# Patient Record
Sex: Female | Born: 1935 | Race: Black or African American | Hispanic: No | Marital: Married | Smoking: Never smoker
Health system: Southern US, Community
[De-identification: ages and names within clinical notes are randomized; demographics above are authoritative.]

## PROBLEM LIST (undated history)

## (undated) DIAGNOSIS — I10 Essential (primary) hypertension: Secondary | ICD-10-CM

## (undated) DIAGNOSIS — E1140 Type 2 diabetes mellitus with diabetic neuropathy, unspecified: Secondary | ICD-10-CM

## (undated) DIAGNOSIS — E119 Type 2 diabetes mellitus without complications: Secondary | ICD-10-CM

## (undated) HISTORY — DX: Type 2 diabetes mellitus with diabetic neuropathy, unspecified: E11.40

## (undated) HISTORY — PX: CATARACT EXTRACTION: SUR2

---

## 2001-02-08 ENCOUNTER — Encounter: Payer: Self-pay | Admitting: Emergency Medicine

## 2001-02-08 ENCOUNTER — Emergency Department (HOSPITAL_COMMUNITY): Admission: EM | Admit: 2001-02-08 | Discharge: 2001-02-08 | Payer: Self-pay | Admitting: Emergency Medicine

## 2001-03-09 ENCOUNTER — Ambulatory Visit (HOSPITAL_COMMUNITY): Admission: RE | Admit: 2001-03-09 | Discharge: 2001-03-09 | Payer: Self-pay | Admitting: Internal Medicine

## 2001-03-09 ENCOUNTER — Encounter: Payer: Self-pay | Admitting: Internal Medicine

## 2001-03-14 ENCOUNTER — Other Ambulatory Visit: Admission: RE | Admit: 2001-03-14 | Discharge: 2001-03-14 | Payer: Self-pay | Admitting: Obstetrics and Gynecology

## 2002-07-11 ENCOUNTER — Ambulatory Visit (HOSPITAL_COMMUNITY): Admission: RE | Admit: 2002-07-11 | Discharge: 2002-07-11 | Payer: Self-pay | Admitting: Internal Medicine

## 2002-07-11 ENCOUNTER — Encounter: Payer: Self-pay | Admitting: Internal Medicine

## 2002-10-31 ENCOUNTER — Encounter: Payer: Self-pay | Admitting: Emergency Medicine

## 2002-10-31 ENCOUNTER — Emergency Department (HOSPITAL_COMMUNITY): Admission: EM | Admit: 2002-10-31 | Discharge: 2002-10-31 | Payer: Self-pay | Admitting: Emergency Medicine

## 2003-10-17 ENCOUNTER — Ambulatory Visit (HOSPITAL_COMMUNITY): Admission: RE | Admit: 2003-10-17 | Discharge: 2003-10-17 | Payer: Self-pay | Admitting: Internal Medicine

## 2004-09-30 ENCOUNTER — Ambulatory Visit (HOSPITAL_COMMUNITY): Admission: RE | Admit: 2004-09-30 | Discharge: 2004-09-30 | Payer: Self-pay | Admitting: Ophthalmology

## 2005-05-05 ENCOUNTER — Ambulatory Visit (HOSPITAL_COMMUNITY): Admission: RE | Admit: 2005-05-05 | Discharge: 2005-05-05 | Payer: Self-pay | Admitting: Ophthalmology

## 2005-11-09 ENCOUNTER — Ambulatory Visit (HOSPITAL_COMMUNITY): Admission: RE | Admit: 2005-11-09 | Discharge: 2005-11-09 | Payer: Self-pay | Admitting: Internal Medicine

## 2009-02-06 ENCOUNTER — Ambulatory Visit (HOSPITAL_COMMUNITY): Admission: RE | Admit: 2009-02-06 | Discharge: 2009-02-06 | Payer: Self-pay | Admitting: Internal Medicine

## 2010-10-24 NOTE — Op Note (Signed)
NAMESEREENA, MARANDO              ACCOUNT NO.:  [id number]   MEDICAL RECORD NO.:  [id number]          PATIENT TYPE:  AMB   LOCATION:  SDS                          FACILITY:  [facility]   PHYSICIAN:  Alford Highland. Rankin, M.D.   DATE OF BIRTH:  [birth year]   DATE OF PROCEDURE:  05/05/2005  DATE OF DISCHARGE:                                 OPERATIVE REPORT   PREOPERATIVE DIAGNOSIS:  1.  Clinically significant macular edema of the right eye.  2.  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  3.  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   POSTOPERATIVE DIAGNOSIS:  1.  Clinically significant macular edema of the right eye.  2.  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  3.  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   PROCEDURE:  Posterior vitrectomy with endolaser panphotocoagulation OD.   ANESTHESIA:  Local retrobulbar and monitored anesthesia care.   SURGEON:  Alford Highland. Rankin, M.D.   INDICATIONS FOR PROCEDURE:  The patient is a 77 year old woman who has  persistent chronic cystic macular edema despite previous panphotocoagulation  and previous intravitreal Kenalog.  The patient understands this is an  attempt to induce coalescence of diabetic retinopathy but also to induce  resolution of the chronic macular edema and preserve, protect, and  potentially improve visual acuity function of this right eye.  The patient  understands the risks of anesthesia including the occurrence of death, as  well as to the eye, including but not limited to hemorrhage, infection,  scarring, need for further surgery, no change in vision, loss of vision,  progression of disease despite intervention.  Appropriate signed consent was  obtained.  The patient was taken to the operating room.   In the operating room, appropriate monitors were applied followed by mild  sedations.  0.75% Marcaine delivered, 5 mL retrobulbar and an additional 5  mL for a modified  [employer]  The right periocular region was sterilely  prepped and draped in the usual ophthalmic fashion.  A lid speculum was  applied.  A 25 gauge vitrectomy was then carried out and biplane entries  were made in each of the quadrants.  A core vitrectomy was then begun.  The  vitreous skirt trimmed 360 degrees.  The hyaloid did not require iatrogenic  removal.  At this time, endolaser photocoagulation was placed in the far  retinal periphery, particularly inferiorly, nasally, and temporally.  No  complications occurred.  Under high infusion pressures, the trocars were  removed and the high end pressure in the eye was maintained with  no egress in the subconjunctival regions.  Subconjunctival injections of  Decadron were applied.  A sterile patch and Fox shield were applied.  The  patient was awakened and taken to the short stay area to be discharged home  as an outpatient.      Alford Highland Rankin, M.D.  Electronically Signed     GAR/MEDQ  D:  05/05/2005  T:  05/05/2005  Job:  [id number]

## 2012-08-22 ENCOUNTER — Ambulatory Visit (HOSPITAL_COMMUNITY)
Admission: RE | Admit: 2012-08-22 | Discharge: 2012-08-22 | Disposition: A | Discharge status: Home / Self Care | Payer: Medicare HMO | Source: Ambulatory Visit | Attending: Internal Medicine | Admitting: Internal Medicine

## 2012-08-22 ENCOUNTER — Other Ambulatory Visit (HOSPITAL_COMMUNITY): Payer: Self-pay | Admitting: Internal Medicine

## 2012-08-22 DIAGNOSIS — N19 Unspecified kidney failure: Secondary | ICD-10-CM | POA: Insufficient documentation

## 2014-06-14 DIAGNOSIS — I120 Hypertensive chronic kidney disease with stage 5 chronic kidney disease or end stage renal disease: Secondary | ICD-10-CM | POA: Diagnosis not present

## 2014-06-14 DIAGNOSIS — E119 Type 2 diabetes mellitus without complications: Secondary | ICD-10-CM | POA: Diagnosis not present

## 2014-06-14 DIAGNOSIS — N189 Chronic kidney disease, unspecified: Secondary | ICD-10-CM | POA: Diagnosis not present

## 2014-06-14 DIAGNOSIS — F419 Anxiety disorder, unspecified: Secondary | ICD-10-CM | POA: Diagnosis not present

## 2014-06-14 DIAGNOSIS — E785 Hyperlipidemia, unspecified: Secondary | ICD-10-CM | POA: Diagnosis not present

## 2014-06-14 DIAGNOSIS — Z794 Long term (current) use of insulin: Secondary | ICD-10-CM | POA: Diagnosis not present

## 2014-06-25 DIAGNOSIS — H31009 Unspecified chorioretinal scars, unspecified eye: Secondary | ICD-10-CM | POA: Diagnosis not present

## 2014-06-25 DIAGNOSIS — H4011X3 Primary open-angle glaucoma, severe stage: Secondary | ICD-10-CM | POA: Diagnosis not present

## 2014-06-25 DIAGNOSIS — E11359 Type 2 diabetes mellitus with proliferative diabetic retinopathy without macular edema: Secondary | ICD-10-CM | POA: Diagnosis not present

## 2014-06-27 DIAGNOSIS — N189 Chronic kidney disease, unspecified: Secondary | ICD-10-CM | POA: Diagnosis not present

## 2014-06-27 DIAGNOSIS — I120 Hypertensive chronic kidney disease with stage 5 chronic kidney disease or end stage renal disease: Secondary | ICD-10-CM | POA: Diagnosis not present

## 2014-06-27 DIAGNOSIS — E119 Type 2 diabetes mellitus without complications: Secondary | ICD-10-CM | POA: Diagnosis not present

## 2014-06-27 DIAGNOSIS — F419 Anxiety disorder, unspecified: Secondary | ICD-10-CM | POA: Diagnosis not present

## 2014-06-27 DIAGNOSIS — Z794 Long term (current) use of insulin: Secondary | ICD-10-CM | POA: Diagnosis not present

## 2014-06-27 DIAGNOSIS — E785 Hyperlipidemia, unspecified: Secondary | ICD-10-CM | POA: Diagnosis not present

## 2014-06-29 DIAGNOSIS — I10 Essential (primary) hypertension: Secondary | ICD-10-CM | POA: Diagnosis not present

## 2014-06-29 DIAGNOSIS — E1140 Type 2 diabetes mellitus with diabetic neuropathy, unspecified: Secondary | ICD-10-CM | POA: Diagnosis not present

## 2014-07-10 DIAGNOSIS — I120 Hypertensive chronic kidney disease with stage 5 chronic kidney disease or end stage renal disease: Secondary | ICD-10-CM | POA: Diagnosis not present

## 2014-07-10 DIAGNOSIS — E785 Hyperlipidemia, unspecified: Secondary | ICD-10-CM | POA: Diagnosis not present

## 2014-07-10 DIAGNOSIS — N189 Chronic kidney disease, unspecified: Secondary | ICD-10-CM | POA: Diagnosis not present

## 2014-07-10 DIAGNOSIS — F419 Anxiety disorder, unspecified: Secondary | ICD-10-CM | POA: Diagnosis not present

## 2014-07-10 DIAGNOSIS — E119 Type 2 diabetes mellitus without complications: Secondary | ICD-10-CM | POA: Diagnosis not present

## 2014-07-10 DIAGNOSIS — Z794 Long term (current) use of insulin: Secondary | ICD-10-CM | POA: Diagnosis not present

## 2014-07-11 DIAGNOSIS — E1140 Type 2 diabetes mellitus with diabetic neuropathy, unspecified: Secondary | ICD-10-CM | POA: Diagnosis not present

## 2014-07-11 DIAGNOSIS — I10 Essential (primary) hypertension: Secondary | ICD-10-CM | POA: Diagnosis not present

## 2014-07-23 DIAGNOSIS — I120 Hypertensive chronic kidney disease with stage 5 chronic kidney disease or end stage renal disease: Secondary | ICD-10-CM | POA: Diagnosis not present

## 2014-07-23 DIAGNOSIS — Z794 Long term (current) use of insulin: Secondary | ICD-10-CM | POA: Diagnosis not present

## 2014-07-23 DIAGNOSIS — F419 Anxiety disorder, unspecified: Secondary | ICD-10-CM | POA: Diagnosis not present

## 2014-07-23 DIAGNOSIS — E785 Hyperlipidemia, unspecified: Secondary | ICD-10-CM | POA: Diagnosis not present

## 2014-07-23 DIAGNOSIS — N189 Chronic kidney disease, unspecified: Secondary | ICD-10-CM | POA: Diagnosis not present

## 2014-07-23 DIAGNOSIS — E119 Type 2 diabetes mellitus without complications: Secondary | ICD-10-CM | POA: Diagnosis not present

## 2014-07-25 DIAGNOSIS — I10 Essential (primary) hypertension: Secondary | ICD-10-CM | POA: Diagnosis not present

## 2014-07-25 DIAGNOSIS — E1140 Type 2 diabetes mellitus with diabetic neuropathy, unspecified: Secondary | ICD-10-CM | POA: Diagnosis not present

## 2014-08-28 DIAGNOSIS — I10 Essential (primary) hypertension: Secondary | ICD-10-CM | POA: Diagnosis not present

## 2014-08-28 DIAGNOSIS — E1140 Type 2 diabetes mellitus with diabetic neuropathy, unspecified: Secondary | ICD-10-CM | POA: Diagnosis not present

## 2014-10-03 DIAGNOSIS — I10 Essential (primary) hypertension: Secondary | ICD-10-CM | POA: Diagnosis not present

## 2014-10-03 DIAGNOSIS — E1140 Type 2 diabetes mellitus with diabetic neuropathy, unspecified: Secondary | ICD-10-CM | POA: Diagnosis not present

## 2014-10-19 DIAGNOSIS — I10 Essential (primary) hypertension: Secondary | ICD-10-CM | POA: Diagnosis not present

## 2014-10-19 DIAGNOSIS — N181 Chronic kidney disease, stage 1: Secondary | ICD-10-CM | POA: Diagnosis not present

## 2014-10-19 DIAGNOSIS — N390 Urinary tract infection, site not specified: Secondary | ICD-10-CM | POA: Diagnosis not present

## 2014-10-19 DIAGNOSIS — E785 Hyperlipidemia, unspecified: Secondary | ICD-10-CM | POA: Diagnosis not present

## 2014-10-19 DIAGNOSIS — E119 Type 2 diabetes mellitus without complications: Secondary | ICD-10-CM | POA: Diagnosis not present

## 2014-10-19 DIAGNOSIS — I120 Hypertensive chronic kidney disease with stage 5 chronic kidney disease or end stage renal disease: Secondary | ICD-10-CM | POA: Diagnosis not present

## 2014-10-19 DIAGNOSIS — E1142 Type 2 diabetes mellitus with diabetic polyneuropathy: Secondary | ICD-10-CM | POA: Diagnosis not present

## 2014-10-22 DIAGNOSIS — E11359 Type 2 diabetes mellitus with proliferative diabetic retinopathy without macular edema: Secondary | ICD-10-CM | POA: Diagnosis not present

## 2014-10-22 DIAGNOSIS — H4011X3 Primary open-angle glaucoma, severe stage: Secondary | ICD-10-CM | POA: Diagnosis not present

## 2014-10-29 DIAGNOSIS — D638 Anemia in other chronic diseases classified elsewhere: Secondary | ICD-10-CM | POA: Diagnosis not present

## 2014-10-29 DIAGNOSIS — R809 Proteinuria, unspecified: Secondary | ICD-10-CM | POA: Diagnosis not present

## 2014-10-29 DIAGNOSIS — I10 Essential (primary) hypertension: Secondary | ICD-10-CM | POA: Diagnosis not present

## 2014-10-29 DIAGNOSIS — E1129 Type 2 diabetes mellitus with other diabetic kidney complication: Secondary | ICD-10-CM | POA: Diagnosis not present

## 2014-10-29 DIAGNOSIS — N183 Chronic kidney disease, stage 3 (moderate): Secondary | ICD-10-CM | POA: Diagnosis not present

## 2014-10-31 DIAGNOSIS — N183 Chronic kidney disease, stage 3 (moderate): Secondary | ICD-10-CM | POA: Diagnosis not present

## 2014-10-31 DIAGNOSIS — I10 Essential (primary) hypertension: Secondary | ICD-10-CM | POA: Diagnosis not present

## 2014-10-31 DIAGNOSIS — R809 Proteinuria, unspecified: Secondary | ICD-10-CM | POA: Diagnosis not present

## 2014-10-31 DIAGNOSIS — D638 Anemia in other chronic diseases classified elsewhere: Secondary | ICD-10-CM | POA: Diagnosis not present

## 2014-11-07 DIAGNOSIS — H4011X3 Primary open-angle glaucoma, severe stage: Secondary | ICD-10-CM | POA: Diagnosis not present

## 2014-11-19 DIAGNOSIS — I10 Essential (primary) hypertension: Secondary | ICD-10-CM | POA: Diagnosis not present

## 2014-11-19 DIAGNOSIS — E1140 Type 2 diabetes mellitus with diabetic neuropathy, unspecified: Secondary | ICD-10-CM | POA: Diagnosis not present

## 2014-12-19 DIAGNOSIS — E1140 Type 2 diabetes mellitus with diabetic neuropathy, unspecified: Secondary | ICD-10-CM | POA: Diagnosis not present

## 2014-12-19 DIAGNOSIS — I10 Essential (primary) hypertension: Secondary | ICD-10-CM | POA: Diagnosis not present

## 2015-01-01 DIAGNOSIS — I10 Essential (primary) hypertension: Secondary | ICD-10-CM | POA: Diagnosis not present

## 2015-01-01 DIAGNOSIS — Z961 Presence of intraocular lens: Secondary | ICD-10-CM | POA: Diagnosis not present

## 2015-01-01 DIAGNOSIS — H2512 Age-related nuclear cataract, left eye: Secondary | ICD-10-CM | POA: Diagnosis not present

## 2015-01-01 DIAGNOSIS — H18411 Arcus senilis, right eye: Secondary | ICD-10-CM | POA: Diagnosis not present

## 2015-01-19 DIAGNOSIS — I10 Essential (primary) hypertension: Secondary | ICD-10-CM | POA: Diagnosis not present

## 2015-01-19 DIAGNOSIS — E1140 Type 2 diabetes mellitus with diabetic neuropathy, unspecified: Secondary | ICD-10-CM | POA: Diagnosis not present

## 2015-02-19 DIAGNOSIS — E1140 Type 2 diabetes mellitus with diabetic neuropathy, unspecified: Secondary | ICD-10-CM | POA: Diagnosis not present

## 2015-02-19 DIAGNOSIS — I10 Essential (primary) hypertension: Secondary | ICD-10-CM | POA: Diagnosis not present

## 2015-02-27 DIAGNOSIS — E119 Type 2 diabetes mellitus without complications: Secondary | ICD-10-CM | POA: Diagnosis not present

## 2015-02-27 DIAGNOSIS — N390 Urinary tract infection, site not specified: Secondary | ICD-10-CM | POA: Diagnosis not present

## 2015-02-27 DIAGNOSIS — I120 Hypertensive chronic kidney disease with stage 5 chronic kidney disease or end stage renal disease: Secondary | ICD-10-CM | POA: Diagnosis not present

## 2015-02-27 DIAGNOSIS — I10 Essential (primary) hypertension: Secondary | ICD-10-CM | POA: Diagnosis not present

## 2015-02-27 DIAGNOSIS — E1165 Type 2 diabetes mellitus with hyperglycemia: Secondary | ICD-10-CM | POA: Diagnosis not present

## 2015-03-04 DIAGNOSIS — H25812 Combined forms of age-related cataract, left eye: Secondary | ICD-10-CM | POA: Diagnosis not present

## 2015-03-04 DIAGNOSIS — H2512 Age-related nuclear cataract, left eye: Secondary | ICD-10-CM | POA: Diagnosis not present

## 2015-03-29 DIAGNOSIS — I10 Essential (primary) hypertension: Secondary | ICD-10-CM | POA: Diagnosis not present

## 2015-03-29 DIAGNOSIS — E1140 Type 2 diabetes mellitus with diabetic neuropathy, unspecified: Secondary | ICD-10-CM | POA: Diagnosis not present

## 2015-04-01 DIAGNOSIS — E113553 Type 2 diabetes mellitus with stable proliferative diabetic retinopathy, bilateral: Secondary | ICD-10-CM | POA: Diagnosis not present

## 2015-04-01 DIAGNOSIS — H401132 Primary open-angle glaucoma, bilateral, moderate stage: Secondary | ICD-10-CM | POA: Diagnosis not present

## 2015-04-01 DIAGNOSIS — Z961 Presence of intraocular lens: Secondary | ICD-10-CM | POA: Diagnosis not present

## 2015-04-26 DIAGNOSIS — N390 Urinary tract infection, site not specified: Secondary | ICD-10-CM | POA: Diagnosis not present

## 2015-04-26 DIAGNOSIS — N182 Chronic kidney disease, stage 2 (mild): Secondary | ICD-10-CM | POA: Diagnosis not present

## 2015-04-26 DIAGNOSIS — F419 Anxiety disorder, unspecified: Secondary | ICD-10-CM | POA: Diagnosis not present

## 2015-04-26 DIAGNOSIS — E119 Type 2 diabetes mellitus without complications: Secondary | ICD-10-CM | POA: Diagnosis not present

## 2015-04-26 DIAGNOSIS — Z23 Encounter for immunization: Secondary | ICD-10-CM | POA: Diagnosis not present

## 2015-04-26 DIAGNOSIS — E1142 Type 2 diabetes mellitus with diabetic polyneuropathy: Secondary | ICD-10-CM | POA: Diagnosis not present

## 2015-04-26 DIAGNOSIS — I10 Essential (primary) hypertension: Secondary | ICD-10-CM | POA: Diagnosis not present

## 2015-04-26 DIAGNOSIS — I120 Hypertensive chronic kidney disease with stage 5 chronic kidney disease or end stage renal disease: Secondary | ICD-10-CM | POA: Diagnosis not present

## 2015-05-06 DIAGNOSIS — D638 Anemia in other chronic diseases classified elsewhere: Secondary | ICD-10-CM | POA: Diagnosis not present

## 2015-05-06 DIAGNOSIS — E1129 Type 2 diabetes mellitus with other diabetic kidney complication: Secondary | ICD-10-CM | POA: Diagnosis not present

## 2015-05-06 DIAGNOSIS — I10 Essential (primary) hypertension: Secondary | ICD-10-CM | POA: Diagnosis not present

## 2015-05-06 DIAGNOSIS — N183 Chronic kidney disease, stage 3 (moderate): Secondary | ICD-10-CM | POA: Diagnosis not present

## 2015-05-06 DIAGNOSIS — R809 Proteinuria, unspecified: Secondary | ICD-10-CM | POA: Diagnosis not present

## 2015-05-06 DIAGNOSIS — Z79899 Other long term (current) drug therapy: Secondary | ICD-10-CM | POA: Diagnosis not present

## 2015-05-08 DIAGNOSIS — N183 Chronic kidney disease, stage 3 (moderate): Secondary | ICD-10-CM | POA: Diagnosis not present

## 2015-05-08 DIAGNOSIS — I10 Essential (primary) hypertension: Secondary | ICD-10-CM | POA: Diagnosis not present

## 2015-05-08 DIAGNOSIS — E1129 Type 2 diabetes mellitus with other diabetic kidney complication: Secondary | ICD-10-CM | POA: Diagnosis not present

## 2015-05-08 DIAGNOSIS — R809 Proteinuria, unspecified: Secondary | ICD-10-CM | POA: Diagnosis not present

## 2015-05-08 DIAGNOSIS — D649 Anemia, unspecified: Secondary | ICD-10-CM | POA: Diagnosis not present

## 2015-05-27 DIAGNOSIS — E1142 Type 2 diabetes mellitus with diabetic polyneuropathy: Secondary | ICD-10-CM | POA: Diagnosis not present

## 2015-05-27 DIAGNOSIS — I10 Essential (primary) hypertension: Secondary | ICD-10-CM | POA: Diagnosis not present

## 2015-06-06 DIAGNOSIS — I10 Essential (primary) hypertension: Secondary | ICD-10-CM | POA: Diagnosis not present

## 2015-06-06 DIAGNOSIS — E1142 Type 2 diabetes mellitus with diabetic polyneuropathy: Secondary | ICD-10-CM | POA: Diagnosis not present

## 2015-06-06 DIAGNOSIS — G301 Alzheimer's disease with late onset: Secondary | ICD-10-CM | POA: Diagnosis not present

## 2015-06-07 DIAGNOSIS — H401131 Primary open-angle glaucoma, bilateral, mild stage: Secondary | ICD-10-CM | POA: Diagnosis not present

## 2015-06-07 DIAGNOSIS — Z961 Presence of intraocular lens: Secondary | ICD-10-CM | POA: Diagnosis not present

## 2015-06-07 DIAGNOSIS — H401133 Primary open-angle glaucoma, bilateral, severe stage: Secondary | ICD-10-CM | POA: Diagnosis not present

## 2015-07-07 DIAGNOSIS — I10 Essential (primary) hypertension: Secondary | ICD-10-CM | POA: Diagnosis not present

## 2015-07-07 DIAGNOSIS — E1142 Type 2 diabetes mellitus with diabetic polyneuropathy: Secondary | ICD-10-CM | POA: Diagnosis not present

## 2015-08-06 DIAGNOSIS — E1142 Type 2 diabetes mellitus with diabetic polyneuropathy: Secondary | ICD-10-CM | POA: Diagnosis not present

## 2015-08-06 DIAGNOSIS — I10 Essential (primary) hypertension: Secondary | ICD-10-CM | POA: Diagnosis not present

## 2015-09-03 DIAGNOSIS — E1142 Type 2 diabetes mellitus with diabetic polyneuropathy: Secondary | ICD-10-CM | POA: Diagnosis not present

## 2015-09-03 DIAGNOSIS — N182 Chronic kidney disease, stage 2 (mild): Secondary | ICD-10-CM | POA: Diagnosis not present

## 2015-09-04 DIAGNOSIS — E113553 Type 2 diabetes mellitus with stable proliferative diabetic retinopathy, bilateral: Secondary | ICD-10-CM | POA: Diagnosis not present

## 2015-09-04 DIAGNOSIS — H401132 Primary open-angle glaucoma, bilateral, moderate stage: Secondary | ICD-10-CM | POA: Diagnosis not present

## 2015-09-04 DIAGNOSIS — Z961 Presence of intraocular lens: Secondary | ICD-10-CM | POA: Diagnosis not present

## 2015-09-25 DIAGNOSIS — H401132 Primary open-angle glaucoma, bilateral, moderate stage: Secondary | ICD-10-CM | POA: Diagnosis not present

## 2015-09-25 DIAGNOSIS — Z961 Presence of intraocular lens: Secondary | ICD-10-CM | POA: Diagnosis not present

## 2015-09-25 DIAGNOSIS — E113553 Type 2 diabetes mellitus with stable proliferative diabetic retinopathy, bilateral: Secondary | ICD-10-CM | POA: Diagnosis not present

## 2015-10-04 DIAGNOSIS — I10 Essential (primary) hypertension: Secondary | ICD-10-CM | POA: Diagnosis not present

## 2015-10-04 DIAGNOSIS — E1142 Type 2 diabetes mellitus with diabetic polyneuropathy: Secondary | ICD-10-CM | POA: Diagnosis not present

## 2015-10-22 DIAGNOSIS — N183 Chronic kidney disease, stage 3 (moderate): Secondary | ICD-10-CM | POA: Diagnosis not present

## 2015-10-22 DIAGNOSIS — D509 Iron deficiency anemia, unspecified: Secondary | ICD-10-CM | POA: Diagnosis not present

## 2015-10-22 DIAGNOSIS — E1129 Type 2 diabetes mellitus with other diabetic kidney complication: Secondary | ICD-10-CM | POA: Diagnosis not present

## 2015-10-22 DIAGNOSIS — D638 Anemia in other chronic diseases classified elsewhere: Secondary | ICD-10-CM | POA: Diagnosis not present

## 2015-10-22 DIAGNOSIS — I10 Essential (primary) hypertension: Secondary | ICD-10-CM | POA: Diagnosis not present

## 2015-10-22 DIAGNOSIS — R809 Proteinuria, unspecified: Secondary | ICD-10-CM | POA: Diagnosis not present

## 2015-10-22 DIAGNOSIS — Z79899 Other long term (current) drug therapy: Secondary | ICD-10-CM | POA: Diagnosis not present

## 2015-10-25 DIAGNOSIS — N182 Chronic kidney disease, stage 2 (mild): Secondary | ICD-10-CM | POA: Diagnosis not present

## 2015-10-25 DIAGNOSIS — N390 Urinary tract infection, site not specified: Secondary | ICD-10-CM | POA: Diagnosis not present

## 2015-10-25 DIAGNOSIS — E119 Type 2 diabetes mellitus without complications: Secondary | ICD-10-CM | POA: Diagnosis not present

## 2015-10-25 DIAGNOSIS — I120 Hypertensive chronic kidney disease with stage 5 chronic kidney disease or end stage renal disease: Secondary | ICD-10-CM | POA: Diagnosis not present

## 2015-10-25 DIAGNOSIS — I10 Essential (primary) hypertension: Secondary | ICD-10-CM | POA: Diagnosis not present

## 2015-11-05 DIAGNOSIS — Z111 Encounter for screening for respiratory tuberculosis: Secondary | ICD-10-CM | POA: Diagnosis not present

## 2015-11-06 DIAGNOSIS — E1129 Type 2 diabetes mellitus with other diabetic kidney complication: Secondary | ICD-10-CM | POA: Diagnosis not present

## 2015-11-06 DIAGNOSIS — E559 Vitamin D deficiency, unspecified: Secondary | ICD-10-CM | POA: Diagnosis not present

## 2015-11-06 DIAGNOSIS — I10 Essential (primary) hypertension: Secondary | ICD-10-CM | POA: Diagnosis not present

## 2015-11-06 DIAGNOSIS — N183 Chronic kidney disease, stage 3 (moderate): Secondary | ICD-10-CM | POA: Diagnosis not present

## 2015-11-06 DIAGNOSIS — R809 Proteinuria, unspecified: Secondary | ICD-10-CM | POA: Diagnosis not present

## 2015-12-20 DIAGNOSIS — I10 Essential (primary) hypertension: Secondary | ICD-10-CM | POA: Diagnosis not present

## 2015-12-20 DIAGNOSIS — E1165 Type 2 diabetes mellitus with hyperglycemia: Secondary | ICD-10-CM | POA: Diagnosis not present

## 2016-01-20 DIAGNOSIS — E1142 Type 2 diabetes mellitus with diabetic polyneuropathy: Secondary | ICD-10-CM | POA: Diagnosis not present

## 2016-01-20 DIAGNOSIS — I10 Essential (primary) hypertension: Secondary | ICD-10-CM | POA: Diagnosis not present

## 2016-02-06 DIAGNOSIS — S8002XA Contusion of left knee, initial encounter: Secondary | ICD-10-CM | POA: Diagnosis not present

## 2016-02-06 DIAGNOSIS — M11261 Other chondrocalcinosis, right knee: Secondary | ICD-10-CM | POA: Diagnosis not present

## 2016-02-06 DIAGNOSIS — S8001XA Contusion of right knee, initial encounter: Secondary | ICD-10-CM | POA: Diagnosis not present

## 2016-02-06 DIAGNOSIS — M25561 Pain in right knee: Secondary | ICD-10-CM | POA: Diagnosis not present

## 2016-02-13 ENCOUNTER — Ambulatory Visit (HOSPITAL_COMMUNITY): Payer: Medicare HMO | Admitting: Physical Therapy

## 2016-02-13 ENCOUNTER — Encounter (HOSPITAL_COMMUNITY): Payer: Self-pay

## 2016-02-20 DIAGNOSIS — E119 Type 2 diabetes mellitus without complications: Secondary | ICD-10-CM | POA: Diagnosis not present

## 2016-02-20 DIAGNOSIS — I10 Essential (primary) hypertension: Secondary | ICD-10-CM | POA: Diagnosis not present

## 2016-03-20 DIAGNOSIS — M25561 Pain in right knee: Secondary | ICD-10-CM | POA: Diagnosis not present

## 2016-03-20 DIAGNOSIS — M170 Bilateral primary osteoarthritis of knee: Secondary | ICD-10-CM | POA: Diagnosis not present

## 2016-03-20 DIAGNOSIS — S8981XD Other specified injuries of right lower leg, subsequent encounter: Secondary | ICD-10-CM | POA: Diagnosis not present

## 2016-03-20 DIAGNOSIS — S8002XA Contusion of left knee, initial encounter: Secondary | ICD-10-CM | POA: Diagnosis not present

## 2016-03-20 DIAGNOSIS — S8982XD Other specified injuries of left lower leg, subsequent encounter: Secondary | ICD-10-CM | POA: Diagnosis not present

## 2016-03-21 DIAGNOSIS — E119 Type 2 diabetes mellitus without complications: Secondary | ICD-10-CM | POA: Diagnosis not present

## 2016-03-21 DIAGNOSIS — N182 Chronic kidney disease, stage 2 (mild): Secondary | ICD-10-CM | POA: Diagnosis not present

## 2016-04-01 DIAGNOSIS — Z961 Presence of intraocular lens: Secondary | ICD-10-CM | POA: Diagnosis not present

## 2016-04-01 DIAGNOSIS — H401132 Primary open-angle glaucoma, bilateral, moderate stage: Secondary | ICD-10-CM | POA: Diagnosis not present

## 2016-04-01 DIAGNOSIS — E113553 Type 2 diabetes mellitus with stable proliferative diabetic retinopathy, bilateral: Secondary | ICD-10-CM | POA: Diagnosis not present

## 2016-04-24 ENCOUNTER — Other Ambulatory Visit (HOSPITAL_COMMUNITY): Payer: Self-pay | Admitting: Internal Medicine

## 2016-04-24 DIAGNOSIS — I10 Essential (primary) hypertension: Secondary | ICD-10-CM | POA: Diagnosis not present

## 2016-04-24 DIAGNOSIS — Z780 Asymptomatic menopausal state: Secondary | ICD-10-CM

## 2016-04-24 DIAGNOSIS — Z0000 Encounter for general adult medical examination without abnormal findings: Secondary | ICD-10-CM | POA: Diagnosis not present

## 2016-04-24 DIAGNOSIS — E119 Type 2 diabetes mellitus without complications: Secondary | ICD-10-CM | POA: Diagnosis not present

## 2016-04-24 DIAGNOSIS — F419 Anxiety disorder, unspecified: Secondary | ICD-10-CM | POA: Diagnosis not present

## 2016-04-24 DIAGNOSIS — N390 Urinary tract infection, site not specified: Secondary | ICD-10-CM | POA: Diagnosis not present

## 2016-04-24 DIAGNOSIS — E1142 Type 2 diabetes mellitus with diabetic polyneuropathy: Secondary | ICD-10-CM | POA: Diagnosis not present

## 2016-04-24 DIAGNOSIS — Z23 Encounter for immunization: Secondary | ICD-10-CM | POA: Diagnosis not present

## 2016-04-24 DIAGNOSIS — I70234 Atherosclerosis of native arteries of right leg with ulceration of heel and midfoot: Secondary | ICD-10-CM | POA: Diagnosis not present

## 2016-04-24 DIAGNOSIS — I120 Hypertensive chronic kidney disease with stage 5 chronic kidney disease or end stage renal disease: Secondary | ICD-10-CM | POA: Diagnosis not present

## 2016-04-26 ENCOUNTER — Encounter (HOSPITAL_COMMUNITY): Payer: Self-pay | Admitting: Emergency Medicine

## 2016-04-26 ENCOUNTER — Emergency Department (HOSPITAL_COMMUNITY)
Admission: EM | Admit: 2016-04-26 | Discharge: 2016-04-26 | Disposition: A | Discharge status: Home / Self Care | Payer: Commercial Managed Care - HMO | Attending: Emergency Medicine | Admitting: Emergency Medicine

## 2016-04-26 ENCOUNTER — Emergency Department (HOSPITAL_COMMUNITY): Payer: Commercial Managed Care - HMO

## 2016-04-26 DIAGNOSIS — Y999 Unspecified external cause status: Secondary | ICD-10-CM | POA: Diagnosis not present

## 2016-04-26 DIAGNOSIS — Y939 Activity, unspecified: Secondary | ICD-10-CM | POA: Insufficient documentation

## 2016-04-26 DIAGNOSIS — Y9222 Religious institution as the place of occurrence of the external cause: Secondary | ICD-10-CM | POA: Insufficient documentation

## 2016-04-26 DIAGNOSIS — M25512 Pain in left shoulder: Secondary | ICD-10-CM | POA: Diagnosis not present

## 2016-04-26 DIAGNOSIS — W19XXXA Unspecified fall, initial encounter: Secondary | ICD-10-CM

## 2016-04-26 DIAGNOSIS — E119 Type 2 diabetes mellitus without complications: Secondary | ICD-10-CM | POA: Diagnosis not present

## 2016-04-26 DIAGNOSIS — S4992XA Unspecified injury of left shoulder and upper arm, initial encounter: Secondary | ICD-10-CM | POA: Diagnosis not present

## 2016-04-26 DIAGNOSIS — S40022A Contusion of left upper arm, initial encounter: Secondary | ICD-10-CM | POA: Diagnosis not present

## 2016-04-26 DIAGNOSIS — S60222A Contusion of left hand, initial encounter: Secondary | ICD-10-CM | POA: Diagnosis not present

## 2016-04-26 DIAGNOSIS — I10 Essential (primary) hypertension: Secondary | ICD-10-CM | POA: Insufficient documentation

## 2016-04-26 DIAGNOSIS — W108XXA Fall (on) (from) other stairs and steps, initial encounter: Secondary | ICD-10-CM | POA: Insufficient documentation

## 2016-04-26 HISTORY — DX: Type 2 diabetes mellitus without complications: E11.9

## 2016-04-26 HISTORY — DX: Essential (primary) hypertension: I10

## 2016-04-26 NOTE — ED Triage Notes (Signed)
Pt fell down the last step and landed on her L arm. Pt denies hitting her head. Pt c/o L shoulder pain.

## 2016-04-26 NOTE — Discharge Instructions (Signed)
X-rays of your arm and shoulder showed no obvious fracture. Ice, sling, Tylenol or Motrin for pain. Follow-up with orthopedic doctor if not improving. Phone number given.

## 2016-04-26 NOTE — ED Provider Notes (Signed)
[facility] DEPT Provider Note   CSN: [id number] Arrival date & time: 04/26/16  1215     History   Chief Complaint Chief Complaint  Patient presents with  . Fall    HPI Annette Fitzgerald is a 82 y.o. female.  Accidental mechanical fall [place] last step [place] [employer] striking her left humerus. No head or neck trauma. No loss of consciousness or neurological deficits. Severity of pain is moderate.      Past Medical History:  Diagnosis Date  . Diabetes mellitus without complication (HCC)   . Hypertension     There are no active problems to display for this patient.   Past Surgical History:  Procedure Laterality Date  . CATARACT EXTRACTION      OB History    Gravida Para Term Preterm AB Living   2 2 2          SAB TAB Ectopic Multiple Live Births                   Home Medications    Prior to Admission medications   Not on File    Family History History reviewed. No pertinent family history.  Social History Social History  Substance Use Topics  . Smoking status: Never Smoker  . Smokeless tobacco: Never Used  . Alcohol use No     Allergies   Patient has no known allergies.   Review of Systems Review of Systems  All other systems reviewed and are negative.    Physical Exam Updated Vital Signs BP 178/67 (BP Location: Right Arm)   Pulse (!) 58   Temp 97.4 F (36.3 C) (Oral)   Resp 16   Ht 5\' 6"  (1.676 m)   Wt 155 lb (70.3 kg)   SpO2 100%   BMI 25.02 kg/m   Physical Exam  Constitutional: She is oriented to person, place, and time. She appears well-developed and well-nourished.  HENT:  Head: Normocephalic and atraumatic.  Eyes: Conjunctivae are normal.  Neck: Neck supple.  Cardiovascular: Normal rate and regular rhythm.   Pulmonary/Chest: Effort normal and breath sounds normal.  Abdominal: Soft. Bowel sounds are normal.  Musculoskeletal:  Left upper extremity: Tender surrounding the left proximal humerus  Neurological: She is  alert and oriented to person, place, and time.  Skin: Skin is warm and dry.  Psychiatric: She has a normal mood and affect. Her behavior is normal.  Nursing note and vitals reviewed.    ED Treatments / Results  Labs (all labs ordered are listed, but only abnormal results are displayed) Labs Reviewed - No data to display  EKG  EKG Interpretation None       Radiology Dg Shoulder Left  Result Date: 04/26/2016 CLINICAL DATA:  Larey SeatFell on arm today.  Pain. EXAM: LEFT SHOULDER - 2+ VIEW COMPARISON:  None. FINDINGS: Degenerative changes in the left [facility]. Glenohumeral joint is intact. No acute bony abnormality. Specifically, no fracture, subluxation, or dislocation. Soft tissues are intact. IMPRESSION: No acute bony abnormality. Electronically Signed   By: Charlett NoseKevin  Dover M.D.   On: 04/26/2016 13:07   Dg Humerus Left  Result Date: 04/26/2016 CLINICAL DATA:  Initial encounter for Pt fell today EXAM: LEFT HUMERUS - 2+ VIEW COMPARISON:  Shoulder films, dictated separately. FINDINGS: No fracture dislocation about the distal humerus. IMPRESSION: No acute findings about the distal humerus. Please see shoulder films, dictated separately. Electronically Signed   By: Jeronimo GreavesKyle  Talbot M.D.   On: 04/26/2016 13:08    Procedures  Procedures (including critical care time)  Medications Ordered in ED Medications - No data to display   Initial Impression / Assessment and Plan / ED Course  I have reviewed the triage vital signs and the nursing notes.  Pertinent labs & imaging results that were available during my care of the patient were reviewed by me and considered in my medical decision making (see chart for details).  Clinical Course     Plain films of left shoulder and left humerus show no acute fractures. Sling, ice, OTC pain medicine, referral to orthopedics  Final Clinical Impressions(s) / ED Diagnoses   Final diagnoses:  Fall, initial encounter  Contusion of left upper extremity, initial  encounter    New Prescriptions New Prescriptions   No medications on file     Donnetta HutchingBrian Lilyanah Celestin, MD 04/27/16 [phone]

## 2016-05-05 ENCOUNTER — Other Ambulatory Visit (HOSPITAL_COMMUNITY): Payer: Medicare HMO

## 2016-05-11 DIAGNOSIS — D638 Anemia in other chronic diseases classified elsewhere: Secondary | ICD-10-CM | POA: Diagnosis not present

## 2016-05-11 DIAGNOSIS — R809 Proteinuria, unspecified: Secondary | ICD-10-CM | POA: Diagnosis not present

## 2016-05-11 DIAGNOSIS — Z79899 Other long term (current) drug therapy: Secondary | ICD-10-CM | POA: Diagnosis not present

## 2016-05-11 DIAGNOSIS — D509 Iron deficiency anemia, unspecified: Secondary | ICD-10-CM | POA: Diagnosis not present

## 2016-05-11 DIAGNOSIS — N183 Chronic kidney disease, stage 3 (moderate): Secondary | ICD-10-CM | POA: Diagnosis not present

## 2016-05-11 DIAGNOSIS — I10 Essential (primary) hypertension: Secondary | ICD-10-CM | POA: Diagnosis not present

## 2016-05-11 DIAGNOSIS — E1129 Type 2 diabetes mellitus with other diabetic kidney complication: Secondary | ICD-10-CM | POA: Diagnosis not present

## 2016-05-13 DIAGNOSIS — I10 Essential (primary) hypertension: Secondary | ICD-10-CM | POA: Diagnosis not present

## 2016-05-13 DIAGNOSIS — R809 Proteinuria, unspecified: Secondary | ICD-10-CM | POA: Diagnosis not present

## 2016-05-13 DIAGNOSIS — E1129 Type 2 diabetes mellitus with other diabetic kidney complication: Secondary | ICD-10-CM | POA: Diagnosis not present

## 2016-05-13 DIAGNOSIS — N183 Chronic kidney disease, stage 3 (moderate): Secondary | ICD-10-CM | POA: Diagnosis not present

## 2016-05-18 ENCOUNTER — Ambulatory Visit (HOSPITAL_COMMUNITY)
Admission: RE | Admit: 2016-05-18 | Discharge: 2016-05-18 | Disposition: A | Discharge status: Home / Self Care | Payer: Commercial Managed Care - HMO | Source: Ambulatory Visit | Attending: Internal Medicine | Admitting: Internal Medicine

## 2016-05-18 DIAGNOSIS — Z780 Asymptomatic menopausal state: Secondary | ICD-10-CM | POA: Insufficient documentation

## 2016-05-18 DIAGNOSIS — M810 Age-related osteoporosis without current pathological fracture: Secondary | ICD-10-CM | POA: Diagnosis not present

## 2016-05-18 DIAGNOSIS — M818 Other osteoporosis without current pathological fracture: Secondary | ICD-10-CM | POA: Diagnosis not present

## 2016-05-21 ENCOUNTER — Other Ambulatory Visit (HOSPITAL_COMMUNITY): Payer: Self-pay | Admitting: Orthopedic Surgery

## 2016-05-21 DIAGNOSIS — S43492A Other sprain of left shoulder joint, initial encounter: Secondary | ICD-10-CM | POA: Diagnosis not present

## 2016-05-21 DIAGNOSIS — S46912A Strain of unspecified muscle, fascia and tendon at shoulder and upper arm level, left arm, initial encounter: Secondary | ICD-10-CM

## 2016-05-21 DIAGNOSIS — S43402A Unspecified sprain of left shoulder joint, initial encounter: Secondary | ICD-10-CM

## 2016-05-21 DIAGNOSIS — S46812A Strain of other muscles, fascia and tendons at shoulder and upper arm level, left arm, initial encounter: Secondary | ICD-10-CM | POA: Diagnosis not present

## 2016-05-21 DIAGNOSIS — M25512 Pain in left shoulder: Secondary | ICD-10-CM | POA: Diagnosis not present

## 2016-05-21 DIAGNOSIS — S46819A Strain of other muscles, fascia and tendons at shoulder and upper arm level, unspecified arm, initial encounter: Secondary | ICD-10-CM | POA: Diagnosis not present

## 2016-05-24 DIAGNOSIS — E1142 Type 2 diabetes mellitus with diabetic polyneuropathy: Secondary | ICD-10-CM | POA: Diagnosis not present

## 2016-05-24 DIAGNOSIS — I10 Essential (primary) hypertension: Secondary | ICD-10-CM | POA: Diagnosis not present

## 2016-05-28 ENCOUNTER — Ambulatory Visit (HOSPITAL_COMMUNITY)
Admission: RE | Admit: 2016-05-28 | Discharge: 2016-05-28 | Disposition: A | Discharge status: Home / Self Care | Payer: Commercial Managed Care - HMO | Source: Ambulatory Visit | Attending: Orthopedic Surgery | Admitting: Orthopedic Surgery

## 2016-05-28 DIAGNOSIS — S46812A Strain of other muscles, fascia and tendons at shoulder and upper arm level, left arm, initial encounter: Secondary | ICD-10-CM | POA: Diagnosis not present

## 2016-05-28 DIAGNOSIS — M75112 Incomplete rotator cuff tear or rupture of left shoulder, not specified as traumatic: Secondary | ICD-10-CM | POA: Insufficient documentation

## 2016-05-28 DIAGNOSIS — S43402A Unspecified sprain of left shoulder joint, initial encounter: Secondary | ICD-10-CM | POA: Diagnosis present

## 2016-05-28 DIAGNOSIS — S43022A Posterior subluxation of left humerus, initial encounter: Secondary | ICD-10-CM | POA: Insufficient documentation

## 2016-05-28 DIAGNOSIS — M19012 Primary osteoarthritis, left shoulder: Secondary | ICD-10-CM | POA: Diagnosis not present

## 2016-05-28 DIAGNOSIS — S46912A Strain of unspecified muscle, fascia and tendon at shoulder and upper arm level, left arm, initial encounter: Secondary | ICD-10-CM

## 2016-05-28 DIAGNOSIS — M7552 Bursitis of left shoulder: Secondary | ICD-10-CM | POA: Diagnosis not present

## 2016-05-28 DIAGNOSIS — R600 Localized edema: Secondary | ICD-10-CM | POA: Diagnosis not present

## 2016-05-28 DIAGNOSIS — M65812 Other synovitis and tenosynovitis, left shoulder: Secondary | ICD-10-CM | POA: Diagnosis not present

## 2016-05-28 DIAGNOSIS — X58XXXA Exposure to other specified factors, initial encounter: Secondary | ICD-10-CM | POA: Diagnosis not present

## 2016-06-09 DIAGNOSIS — M25512 Pain in left shoulder: Secondary | ICD-10-CM | POA: Diagnosis not present

## 2016-06-09 DIAGNOSIS — S46012A Strain of muscle(s) and tendon(s) of the rotator cuff of left shoulder, initial encounter: Secondary | ICD-10-CM | POA: Diagnosis not present

## 2016-06-24 DIAGNOSIS — E1142 Type 2 diabetes mellitus with diabetic polyneuropathy: Secondary | ICD-10-CM | POA: Diagnosis not present

## 2016-06-24 DIAGNOSIS — I10 Essential (primary) hypertension: Secondary | ICD-10-CM | POA: Diagnosis not present

## 2016-07-07 DIAGNOSIS — M25512 Pain in left shoulder: Secondary | ICD-10-CM | POA: Diagnosis not present

## 2016-07-21 DIAGNOSIS — J069 Acute upper respiratory infection, unspecified: Secondary | ICD-10-CM | POA: Diagnosis not present

## 2016-07-21 DIAGNOSIS — J309 Allergic rhinitis, unspecified: Secondary | ICD-10-CM | POA: Diagnosis not present

## 2016-08-18 DIAGNOSIS — J309 Allergic rhinitis, unspecified: Secondary | ICD-10-CM | POA: Diagnosis not present

## 2016-08-18 DIAGNOSIS — J069 Acute upper respiratory infection, unspecified: Secondary | ICD-10-CM | POA: Diagnosis not present

## 2016-09-17 DIAGNOSIS — I10 Essential (primary) hypertension: Secondary | ICD-10-CM | POA: Diagnosis not present

## 2016-09-17 DIAGNOSIS — N182 Chronic kidney disease, stage 2 (mild): Secondary | ICD-10-CM | POA: Diagnosis not present

## 2016-09-23 DIAGNOSIS — E113553 Type 2 diabetes mellitus with stable proliferative diabetic retinopathy, bilateral: Secondary | ICD-10-CM | POA: Diagnosis not present

## 2016-09-23 DIAGNOSIS — H401132 Primary open-angle glaucoma, bilateral, moderate stage: Secondary | ICD-10-CM | POA: Diagnosis not present

## 2016-09-23 DIAGNOSIS — Z961 Presence of intraocular lens: Secondary | ICD-10-CM | POA: Diagnosis not present

## 2016-10-15 DIAGNOSIS — E784 Other hyperlipidemia: Secondary | ICD-10-CM | POA: Diagnosis not present

## 2016-10-15 DIAGNOSIS — E1142 Type 2 diabetes mellitus with diabetic polyneuropathy: Secondary | ICD-10-CM | POA: Diagnosis not present

## 2016-10-15 DIAGNOSIS — I10 Essential (primary) hypertension: Secondary | ICD-10-CM | POA: Diagnosis not present

## 2016-10-15 DIAGNOSIS — N182 Chronic kidney disease, stage 2 (mild): Secondary | ICD-10-CM | POA: Diagnosis not present

## 2016-11-27 ENCOUNTER — Encounter (HOSPITAL_COMMUNITY): Payer: Self-pay

## 2016-11-27 ENCOUNTER — Ambulatory Visit (HOSPITAL_COMMUNITY): Payer: Commercial Managed Care - HMO | Attending: Specialist

## 2016-11-27 DIAGNOSIS — G8929 Other chronic pain: Secondary | ICD-10-CM | POA: Diagnosis not present

## 2016-11-27 DIAGNOSIS — M25512 Pain in left shoulder: Secondary | ICD-10-CM | POA: Insufficient documentation

## 2016-11-27 DIAGNOSIS — M25612 Stiffness of left shoulder, not elsewhere classified: Secondary | ICD-10-CM | POA: Diagnosis not present

## 2016-11-27 NOTE — Patient Instructions (Signed)
1) Shoulder Protraction    Begin with elbows by your side, slowly "punch" straight out in front of you keeping arms/elbows straight.      2) Shoulder Flexion  Supine:     Standing:         Begin with arms at your side with thumbs pointed up, slowly raise both arms up and forward towards overhead.               3) Horizontal abduction/adduction  Supine:   Standing:           Begin with arms straight out in front of you, bring out to the side in at "T" shape. Keep arms straight entire time.                 4) Internal & External Rotation    *No band* -Stand with elbows at the side and elbows bent 90 degrees. Move your forearms away from your body, then bring back inward toward the body.     5) Shoulder Abduction  Supine:     Standing:       Lying on your back begin with your arms flat on the table next to your side. Slowly move your arms out to the side so that they go overhead, in a jumping jack or snow angel movement.    6) X to V arms (cheerleader move):  Begin with arms straight down, crossed in front of body in an "X". Keeping arms crossed, lift arms straight up overhead. Then spread arms apart into a "V" shape.  Bring back together into x and lower down to starting position.    7) W arms:  Begin with elbows bent and even with shoulders, hands pointing to ceiling. Keeping elbows at shoulder level, 1-shrug shoulders up, 2-squeeze shoulder blades together, and 3-relax.    Repeat all exercises 10-15 times, 1-2 times per day.  

## 2016-11-27 NOTE — Therapy (Addendum)
[facility] [address] [place] [place], [place], [address] Phone: [phone]   Fax:  [phone]  Occupational Therapy Evaluation  Patient Details  Name: Annette Fitzgerald MRN: [id number] Date of Birth: [birth year] Referring Provider: Antionette PolesJefferey Beane, MD  Encounter Date: 11/27/2016      OT End of Session - 11/27/16 1446    Visit Number 1   Number of Visits 1   Authorization Type Medicare   Authorization - Visit Number 1   Authorization - Number of Visits 10   OT Start Time 1348   OT Stop Time 1430   OT Time Calculation (min) 42 min   Activity Tolerance Patient tolerated treatment well   Behavior During Therapy [facility] for tasks assessed/performed      Past Medical History:  Diagnosis Date  . Diabetes mellitus without complication (HCC)   . Hypertension     Past Surgical History:  Procedure Laterality Date  . CATARACT EXTRACTION      There were no vitals filed for this visit.      Subjective Assessment - 11/27/16 1440    Subjective  S: I can still pretty much do everything my arm just hurts.   Pertinent History Pt is a 83 year old female who had a fall on 04/26/16 resulting in left arm pain. Dr. Shelle IronBeane has referred pt to occupational therapy for evaluation as a one time visit and to receive an HEP.   Limitations No medications were listed for pt and she could not recall all of them nor did she bring them. Medications were not able to be enetered during the session.   Patient Stated Goals Pt stated that she wants to be able to move her arm better and use it more as well as clip her bra easier.   Currently in Pain? No/denies           [facility] - 11/27/16 1433      Assessment   Diagnosis Left Shoulder Pain   Referring Provider Antionette PolesJefferey Beane, MD   Onset Date 04/26/16   Assessment No follow-up appointment   Prior Therapy none     Precautions   Precautions None     Restrictions   Weight Bearing Restrictions No      Balance Screen   Has the patient fallen in the past 6 months Yes   How many times? 1   Has the patient had a decrease in activity level because of a fear of falling?  No   Is the patient reluctant to leave their home because of a fear of falling?  No     Home  Environment   Family/patient expects to be discharged to: Private residence   Lives With Daughter;Other (Comment)  Granddaughter     Prior Function   Level of Independence Independent   Vocation Retired   Leisure watching gameshows, fishing     ADL   ADL comments Independent with all ADLs, daughter cooks meals for her and did prior to fall as well.     Mobility   Mobility Status Independent     Written Expression   Dominant Hand Right     Vision - History   Baseline Vision Wears glasses all the time     Cognition   Overall Cognitive Status Within Functional Limits for tasks assessed     ROM / Strength   AROM / PROM / Strength AROM;PROM;Strength     Palpation   Patella mobility Pt had minimal fascial restrictions  in left trapezious, shoulder, and scapular regions.     AROM   Overall AROM  Deficits   Overall AROM Comments Assessed seated. Shoulder adducted for IR/er. Intially, before measurements, pt was able to raise arm up further then once measured she was unable to raise arm as high.   AROM Assessment Site Shoulder   Right/Left Shoulder Left   Left Shoulder Flexion 80 Degrees   Left Shoulder ABduction 95 Degrees   Left Shoulder Internal Rotation 90 Degrees   Left Shoulder External Rotation 45 Degrees     PROM   Overall PROM  Deficits   Overall PROM Comments Assessed supine. Shoulder aducted for IR/er.   PROM Assessment Site Shoulder   Right/Left Shoulder Left   Left Shoulder Flexion 130 Degrees   Left Shoulder ABduction 101 Degrees   Left Shoulder Internal Rotation 90 Degrees   Left Shoulder External Rotation 47 Degrees     Strength   Overall Strength Deficits   Strength Assessment Site Shoulder    Right/Left Shoulder Left   Left Shoulder Flexion 3+/5   Left Shoulder ABduction 4-/5   Left Shoulder Internal Rotation 5/5   Left Shoulder External Rotation 3+/5                         OT Education - 11/27/16 1445    Education provided Yes   Education Details Pt and daughter provided with HEP for shoulder A/ROM exercises, pt educated on self massage and pain management   Person(s) Educated Patient;Child(ren)   Methods Explanation;Demonstration;Verbal cues;Handout   Comprehension Returned demonstration;Verbalized understanding          OT Short Term Goals - 11/27/16 1457      OT SHORT TERM GOAL #1   Title Patient will be educated on a HEP for improved function of LUE during daily activities.   Time 1   Period Days   Status Achieved                  Plan - 11/27/16 1447    Clinical Impression Statement A: Patient is a 83 year old female who had a fall on 04/26/17 resulting in left arm pain causing fascial restrictions and decreased strength and ROM.   Occupational Profile and client history currently impacting functional performance Motivated to return to prior level of function, supportive daughter to help with HEP   Occupational performance deficits (Please refer to evaluation for details): ADL's;IADL's;Rest and Sleep;Leisure   Rehab Potential Good   Current Impairments/barriers affecting progress: Deficits in memory   OT Frequency One time visit   OT Treatment/Interventions Patient/family education   Plan P: One time visit, provided pt with HEP   Clinical Decision Making Limited treatment options, no task modification necessary   OT Home Exercise Plan 11/27/16: shoulder A/ROM exercises   Consulted and Agree with Plan of Care Patient;Family member/caregiver   Family Member Consulted Daughter      Patient will benefit from skilled therapeutic intervention in order to improve the following deficits and impairments:  Decreased range of motion,  Increased fascial restricitons, Impaired UE functional use, Pain, Decreased mobility, Decreased strength  Visit Diagnosis: Chronic left shoulder pain - Plan: Ot plan of care cert/re-cert  Stiffness of left shoulder, not elsewhere classified - Plan: Ot plan of care cert/re-cert    Problem List There are no active problems to display for this patient.   Ricci Barker, OT Student [phone] 11/27/2016, 3:09 PM  [facility] [address] [place], [place], [address] Phone: [phone]   Fax:  [phone]  Name: Annette Fitzgerald MRN: [id number] Date of Birth: [birth year]     12-24-16 1007  OT G-codes  Functional Assessment Tool Used (Outpatient only) Clinical judgement  Functional Limitation Carrying, moving and handling objects  Carrying, Moving and Handling Objects Current Status [phone]) CK  Carrying, Moving and Handling Objects Goal Status [phone]) CK  Carrying, Moving and Handling Objects Discharge Status [phone]) CK    This qualified practitioner was present in the room guiding the student in service delivery. Therapy student was participating in the provision of services, and the practitioner was not engaged in treating another patient or doing other tasks at the same time.  Limmie Patricia, OTR/L,CBIS  [phone]

## 2016-11-30 ENCOUNTER — Encounter (HOSPITAL_COMMUNITY): Payer: Commercial Managed Care - HMO | Admitting: Occupational Therapy

## 2016-12-03 ENCOUNTER — Encounter (HOSPITAL_COMMUNITY): Payer: Commercial Managed Care - HMO | Admitting: Occupational Therapy

## 2016-12-21 DIAGNOSIS — Z79899 Other long term (current) drug therapy: Secondary | ICD-10-CM | POA: Diagnosis not present

## 2016-12-21 DIAGNOSIS — D638 Anemia in other chronic diseases classified elsewhere: Secondary | ICD-10-CM | POA: Diagnosis not present

## 2016-12-21 DIAGNOSIS — N183 Chronic kidney disease, stage 3 (moderate): Secondary | ICD-10-CM | POA: Diagnosis not present

## 2016-12-21 DIAGNOSIS — R809 Proteinuria, unspecified: Secondary | ICD-10-CM | POA: Diagnosis not present

## 2016-12-21 DIAGNOSIS — D509 Iron deficiency anemia, unspecified: Secondary | ICD-10-CM | POA: Diagnosis not present

## 2016-12-21 DIAGNOSIS — E1129 Type 2 diabetes mellitus with other diabetic kidney complication: Secondary | ICD-10-CM | POA: Diagnosis not present

## 2016-12-21 DIAGNOSIS — I10 Essential (primary) hypertension: Secondary | ICD-10-CM | POA: Diagnosis not present

## 2016-12-23 DIAGNOSIS — I10 Essential (primary) hypertension: Secondary | ICD-10-CM | POA: Diagnosis not present

## 2016-12-23 DIAGNOSIS — N183 Chronic kidney disease, stage 3 (moderate): Secondary | ICD-10-CM | POA: Diagnosis not present

## 2016-12-23 DIAGNOSIS — D638 Anemia in other chronic diseases classified elsewhere: Secondary | ICD-10-CM | POA: Diagnosis not present

## 2016-12-23 DIAGNOSIS — N179 Acute kidney failure, unspecified: Secondary | ICD-10-CM | POA: Diagnosis not present

## 2016-12-31 DIAGNOSIS — E1142 Type 2 diabetes mellitus with diabetic polyneuropathy: Secondary | ICD-10-CM | POA: Diagnosis not present

## 2016-12-31 DIAGNOSIS — L80 Vitiligo: Secondary | ICD-10-CM | POA: Diagnosis not present

## 2016-12-31 DIAGNOSIS — I70234 Atherosclerosis of native arteries of right leg with ulceration of heel and midfoot: Secondary | ICD-10-CM | POA: Diagnosis not present

## 2016-12-31 DIAGNOSIS — N182 Chronic kidney disease, stage 2 (mild): Secondary | ICD-10-CM | POA: Diagnosis not present

## 2016-12-31 DIAGNOSIS — I10 Essential (primary) hypertension: Secondary | ICD-10-CM | POA: Diagnosis not present

## 2016-12-31 DIAGNOSIS — F419 Anxiety disorder, unspecified: Secondary | ICD-10-CM | POA: Diagnosis not present

## 2016-12-31 DIAGNOSIS — E784 Other hyperlipidemia: Secondary | ICD-10-CM | POA: Diagnosis not present

## 2017-01-15 DIAGNOSIS — N183 Chronic kidney disease, stage 3 (moderate): Secondary | ICD-10-CM | POA: Diagnosis not present

## 2017-01-15 DIAGNOSIS — I10 Essential (primary) hypertension: Secondary | ICD-10-CM | POA: Diagnosis not present

## 2017-01-15 DIAGNOSIS — Z79899 Other long term (current) drug therapy: Secondary | ICD-10-CM | POA: Diagnosis not present

## 2017-01-25 DIAGNOSIS — Z79899 Other long term (current) drug therapy: Secondary | ICD-10-CM | POA: Diagnosis not present

## 2017-01-25 DIAGNOSIS — D509 Iron deficiency anemia, unspecified: Secondary | ICD-10-CM | POA: Diagnosis not present

## 2017-01-25 DIAGNOSIS — I10 Essential (primary) hypertension: Secondary | ICD-10-CM | POA: Diagnosis not present

## 2017-01-25 DIAGNOSIS — R809 Proteinuria, unspecified: Secondary | ICD-10-CM | POA: Diagnosis not present

## 2017-01-25 DIAGNOSIS — N183 Chronic kidney disease, stage 3 (moderate): Secondary | ICD-10-CM | POA: Diagnosis not present

## 2017-01-27 DIAGNOSIS — N183 Chronic kidney disease, stage 3 (moderate): Secondary | ICD-10-CM | POA: Diagnosis not present

## 2017-01-27 DIAGNOSIS — R809 Proteinuria, unspecified: Secondary | ICD-10-CM | POA: Diagnosis not present

## 2017-01-27 DIAGNOSIS — E1129 Type 2 diabetes mellitus with other diabetic kidney complication: Secondary | ICD-10-CM | POA: Diagnosis not present

## 2017-01-27 DIAGNOSIS — I10 Essential (primary) hypertension: Secondary | ICD-10-CM | POA: Diagnosis not present

## 2017-03-05 DIAGNOSIS — H1045 Other chronic allergic conjunctivitis: Secondary | ICD-10-CM | POA: Diagnosis not present

## 2017-03-05 DIAGNOSIS — H401132 Primary open-angle glaucoma, bilateral, moderate stage: Secondary | ICD-10-CM | POA: Diagnosis not present

## 2017-03-05 DIAGNOSIS — H524 Presbyopia: Secondary | ICD-10-CM | POA: Diagnosis not present

## 2017-03-05 DIAGNOSIS — Z961 Presence of intraocular lens: Secondary | ICD-10-CM | POA: Diagnosis not present

## 2017-03-05 DIAGNOSIS — H5213 Myopia, bilateral: Secondary | ICD-10-CM | POA: Diagnosis not present

## 2017-03-05 DIAGNOSIS — E113553 Type 2 diabetes mellitus with stable proliferative diabetic retinopathy, bilateral: Secondary | ICD-10-CM | POA: Diagnosis not present

## 2017-03-05 DIAGNOSIS — H31093 Other chorioretinal scars, bilateral: Secondary | ICD-10-CM | POA: Diagnosis not present

## 2017-03-22 DIAGNOSIS — E113553 Type 2 diabetes mellitus with stable proliferative diabetic retinopathy, bilateral: Secondary | ICD-10-CM | POA: Diagnosis not present

## 2017-03-22 DIAGNOSIS — H401132 Primary open-angle glaucoma, bilateral, moderate stage: Secondary | ICD-10-CM | POA: Diagnosis not present

## 2017-03-22 DIAGNOSIS — Z961 Presence of intraocular lens: Secondary | ICD-10-CM | POA: Diagnosis not present

## 2017-04-21 DIAGNOSIS — N182 Chronic kidney disease, stage 2 (mild): Secondary | ICD-10-CM | POA: Diagnosis not present

## 2017-04-21 DIAGNOSIS — I120 Hypertensive chronic kidney disease with stage 5 chronic kidney disease or end stage renal disease: Secondary | ICD-10-CM | POA: Diagnosis not present

## 2017-04-21 DIAGNOSIS — Z1389 Encounter for screening for other disorder: Secondary | ICD-10-CM | POA: Diagnosis not present

## 2017-04-21 DIAGNOSIS — F419 Anxiety disorder, unspecified: Secondary | ICD-10-CM | POA: Diagnosis not present

## 2017-04-21 DIAGNOSIS — E7849 Other hyperlipidemia: Secondary | ICD-10-CM | POA: Diagnosis not present

## 2017-04-21 DIAGNOSIS — Z0000 Encounter for general adult medical examination without abnormal findings: Secondary | ICD-10-CM | POA: Diagnosis not present

## 2017-04-21 DIAGNOSIS — Z23 Encounter for immunization: Secondary | ICD-10-CM | POA: Diagnosis not present

## 2017-04-21 DIAGNOSIS — E1142 Type 2 diabetes mellitus with diabetic polyneuropathy: Secondary | ICD-10-CM | POA: Diagnosis not present

## 2017-04-21 DIAGNOSIS — I10 Essential (primary) hypertension: Secondary | ICD-10-CM | POA: Diagnosis not present

## 2017-04-21 DIAGNOSIS — N390 Urinary tract infection, site not specified: Secondary | ICD-10-CM | POA: Diagnosis not present

## 2017-04-21 DIAGNOSIS — E119 Type 2 diabetes mellitus without complications: Secondary | ICD-10-CM | POA: Diagnosis not present

## 2017-04-21 DIAGNOSIS — I70234 Atherosclerosis of native arteries of right leg with ulceration of heel and midfoot: Secondary | ICD-10-CM | POA: Diagnosis not present

## 2017-07-05 DIAGNOSIS — D638 Anemia in other chronic diseases classified elsewhere: Secondary | ICD-10-CM | POA: Diagnosis not present

## 2017-07-05 DIAGNOSIS — N183 Chronic kidney disease, stage 3 (moderate): Secondary | ICD-10-CM | POA: Diagnosis not present

## 2017-07-05 DIAGNOSIS — I10 Essential (primary) hypertension: Secondary | ICD-10-CM | POA: Diagnosis not present

## 2017-07-05 DIAGNOSIS — R809 Proteinuria, unspecified: Secondary | ICD-10-CM | POA: Diagnosis not present

## 2017-07-05 DIAGNOSIS — D509 Iron deficiency anemia, unspecified: Secondary | ICD-10-CM | POA: Diagnosis not present

## 2017-07-05 DIAGNOSIS — E1129 Type 2 diabetes mellitus with other diabetic kidney complication: Secondary | ICD-10-CM | POA: Diagnosis not present

## 2017-07-05 DIAGNOSIS — Z79899 Other long term (current) drug therapy: Secondary | ICD-10-CM | POA: Diagnosis not present

## 2017-07-07 DIAGNOSIS — D638 Anemia in other chronic diseases classified elsewhere: Secondary | ICD-10-CM | POA: Diagnosis not present

## 2017-07-07 DIAGNOSIS — R809 Proteinuria, unspecified: Secondary | ICD-10-CM | POA: Diagnosis not present

## 2017-07-07 DIAGNOSIS — N183 Chronic kidney disease, stage 3 (moderate): Secondary | ICD-10-CM | POA: Diagnosis not present

## 2017-07-22 DIAGNOSIS — Z79899 Other long term (current) drug therapy: Secondary | ICD-10-CM | POA: Diagnosis not present

## 2017-07-22 DIAGNOSIS — N183 Chronic kidney disease, stage 3 (moderate): Secondary | ICD-10-CM | POA: Diagnosis not present

## 2017-07-22 DIAGNOSIS — I70234 Atherosclerosis of native arteries of right leg with ulceration of heel and midfoot: Secondary | ICD-10-CM | POA: Diagnosis not present

## 2017-07-22 DIAGNOSIS — N182 Chronic kidney disease, stage 2 (mild): Secondary | ICD-10-CM | POA: Diagnosis not present

## 2017-08-19 DIAGNOSIS — N182 Chronic kidney disease, stage 2 (mild): Secondary | ICD-10-CM | POA: Diagnosis not present

## 2017-08-19 DIAGNOSIS — E1142 Type 2 diabetes mellitus with diabetic polyneuropathy: Secondary | ICD-10-CM | POA: Diagnosis not present

## 2017-09-15 DIAGNOSIS — H1045 Other chronic allergic conjunctivitis: Secondary | ICD-10-CM | POA: Diagnosis not present

## 2017-09-15 DIAGNOSIS — H401113 Primary open-angle glaucoma, right eye, severe stage: Secondary | ICD-10-CM | POA: Diagnosis not present

## 2017-09-15 DIAGNOSIS — E113593 Type 2 diabetes mellitus with proliferative diabetic retinopathy without macular edema, bilateral: Secondary | ICD-10-CM | POA: Diagnosis not present

## 2017-09-15 DIAGNOSIS — H401122 Primary open-angle glaucoma, left eye, moderate stage: Secondary | ICD-10-CM | POA: Diagnosis not present

## 2017-09-19 DIAGNOSIS — E1142 Type 2 diabetes mellitus with diabetic polyneuropathy: Secondary | ICD-10-CM | POA: Diagnosis not present

## 2017-09-19 DIAGNOSIS — N182 Chronic kidney disease, stage 2 (mild): Secondary | ICD-10-CM | POA: Diagnosis not present

## 2017-10-19 DIAGNOSIS — E1142 Type 2 diabetes mellitus with diabetic polyneuropathy: Secondary | ICD-10-CM | POA: Diagnosis not present

## 2017-10-19 DIAGNOSIS — I10 Essential (primary) hypertension: Secondary | ICD-10-CM | POA: Diagnosis not present

## 2017-10-19 DIAGNOSIS — E7849 Other hyperlipidemia: Secondary | ICD-10-CM | POA: Diagnosis not present

## 2017-10-19 DIAGNOSIS — I70234 Atherosclerosis of native arteries of right leg with ulceration of heel and midfoot: Secondary | ICD-10-CM | POA: Diagnosis not present

## 2017-11-19 DIAGNOSIS — N182 Chronic kidney disease, stage 2 (mild): Secondary | ICD-10-CM | POA: Diagnosis not present

## 2017-11-19 DIAGNOSIS — E1142 Type 2 diabetes mellitus with diabetic polyneuropathy: Secondary | ICD-10-CM | POA: Diagnosis not present

## 2017-12-31 DIAGNOSIS — R809 Proteinuria, unspecified: Secondary | ICD-10-CM | POA: Diagnosis not present

## 2017-12-31 DIAGNOSIS — I10 Essential (primary) hypertension: Secondary | ICD-10-CM | POA: Diagnosis not present

## 2017-12-31 DIAGNOSIS — E559 Vitamin D deficiency, unspecified: Secondary | ICD-10-CM | POA: Diagnosis not present

## 2017-12-31 DIAGNOSIS — Z79899 Other long term (current) drug therapy: Secondary | ICD-10-CM | POA: Diagnosis not present

## 2017-12-31 DIAGNOSIS — D509 Iron deficiency anemia, unspecified: Secondary | ICD-10-CM | POA: Diagnosis not present

## 2017-12-31 DIAGNOSIS — N183 Chronic kidney disease, stage 3 (moderate): Secondary | ICD-10-CM | POA: Diagnosis not present

## 2018-01-03 DIAGNOSIS — I10 Essential (primary) hypertension: Secondary | ICD-10-CM | POA: Diagnosis not present

## 2018-01-03 DIAGNOSIS — R809 Proteinuria, unspecified: Secondary | ICD-10-CM | POA: Diagnosis not present

## 2018-01-03 DIAGNOSIS — E1129 Type 2 diabetes mellitus with other diabetic kidney complication: Secondary | ICD-10-CM | POA: Diagnosis not present

## 2018-01-03 DIAGNOSIS — D649 Anemia, unspecified: Secondary | ICD-10-CM | POA: Diagnosis not present

## 2018-01-03 DIAGNOSIS — N183 Chronic kidney disease, stage 3 (moderate): Secondary | ICD-10-CM | POA: Diagnosis not present

## 2018-01-15 IMAGING — DX DG SHOULDER 2+V*L*
2 series · 2 of 2 positions shown · non-contrast
Comparison: None.

CLINICAL DATA: Fell on arm today.  Pain.

EXAM:
LEFT SHOULDER - 2+ VIEW

[shoulder grashey]
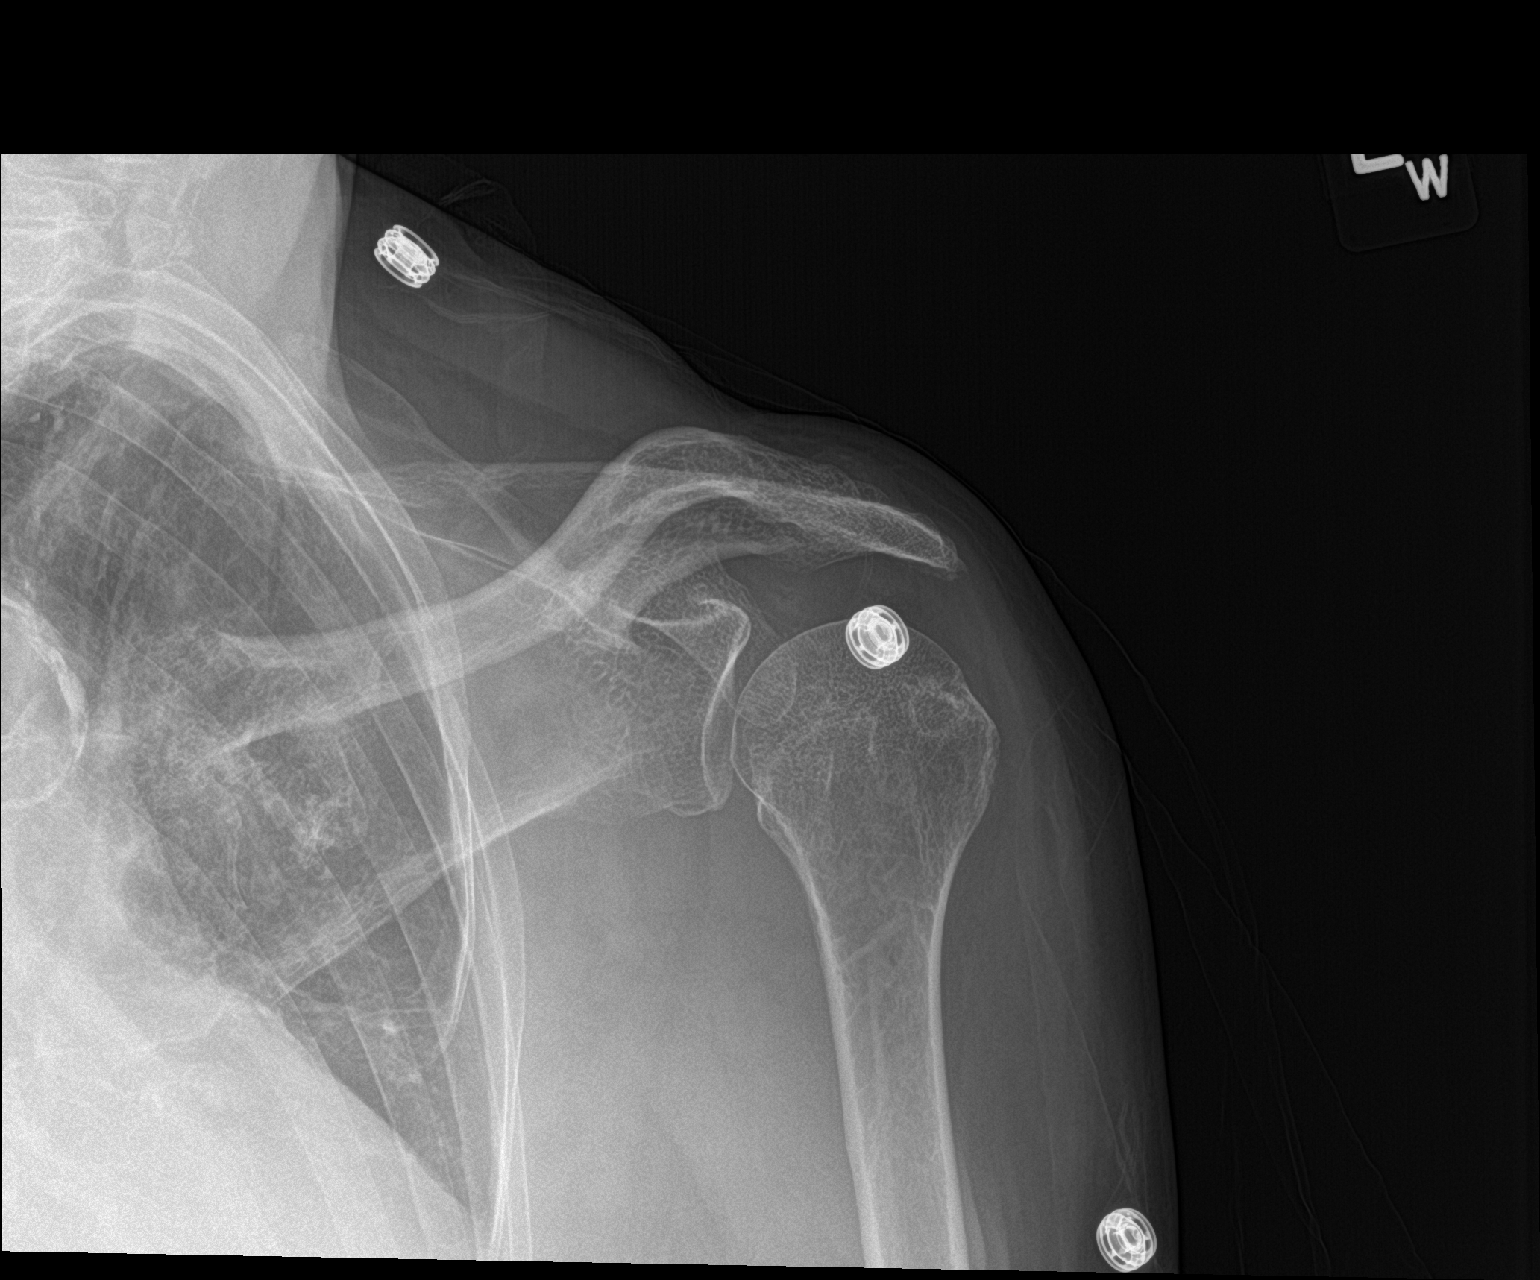

[shoulder y view]
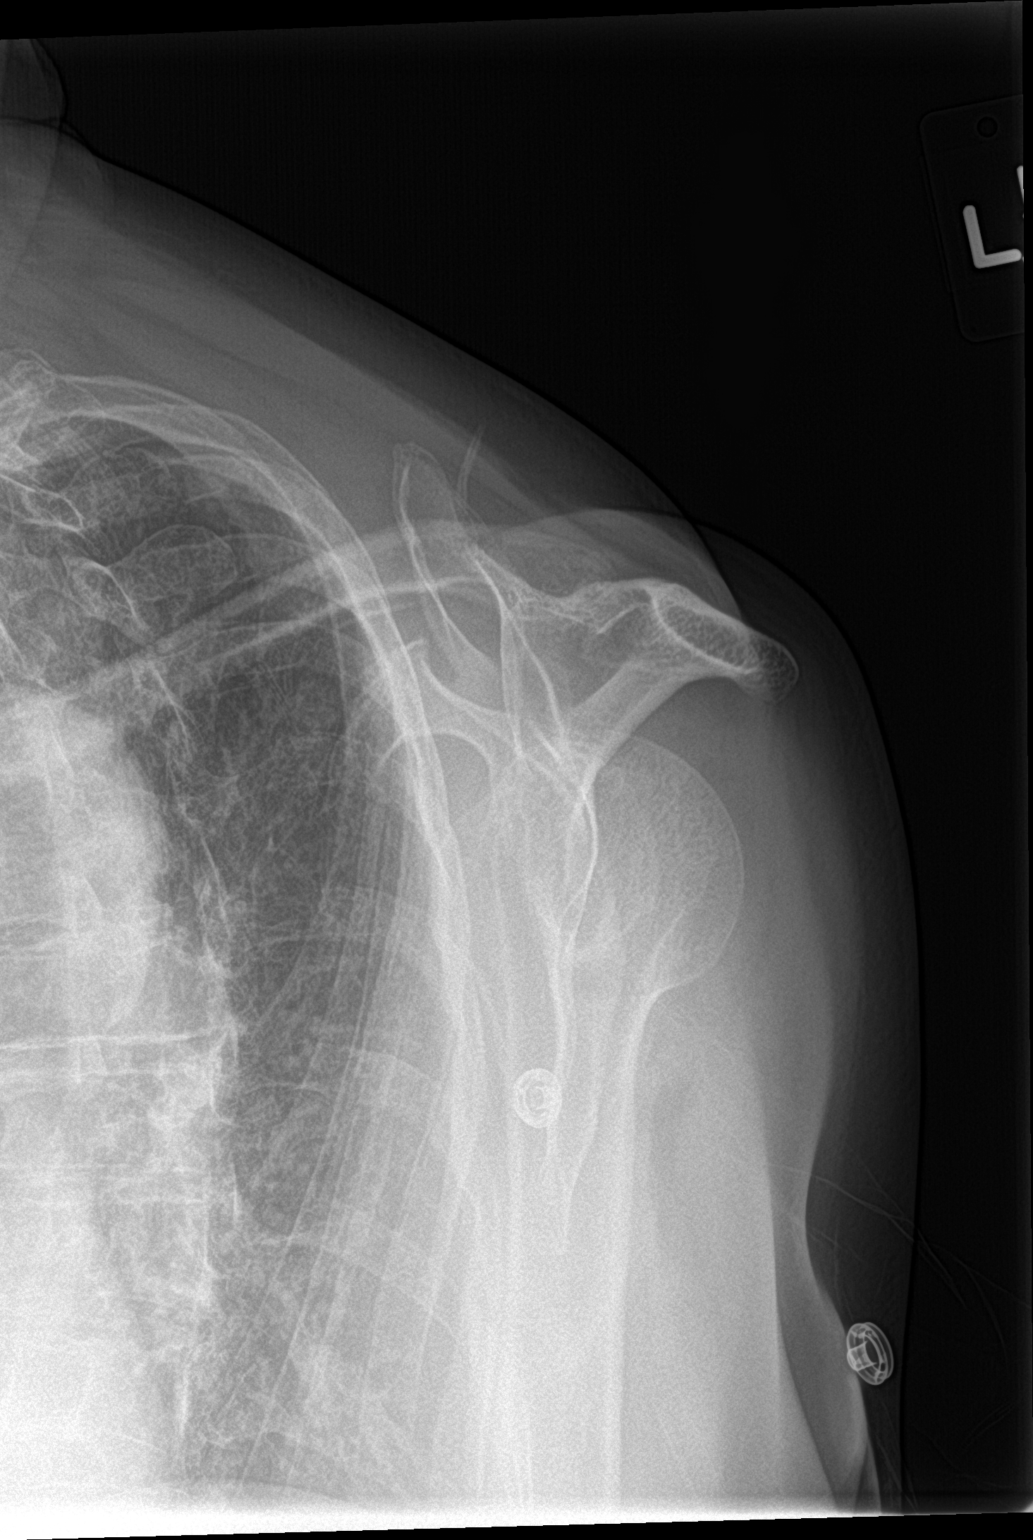

[2 of 2 positions shown; findings below may reference images not displayed]

FINDINGS: Degenerative changes in the left AC joint. Glenohumeral joint is
intact. No acute bony abnormality. Specifically, no fracture,
subluxation, or dislocation. Soft tissues are intact.
IMPRESSION: No acute bony abnormality.

## 2018-01-15 IMAGING — DX DG HUMERUS 2V *L*
2 series · 2 of 2 positions shown · non-contrast
Comparison: Shoulder films, dictated separately.

CLINICAL DATA: Initial encounter for Pt fell today

EXAM:
LEFT HUMERUS - 2+ VIEW

[humerus ap]
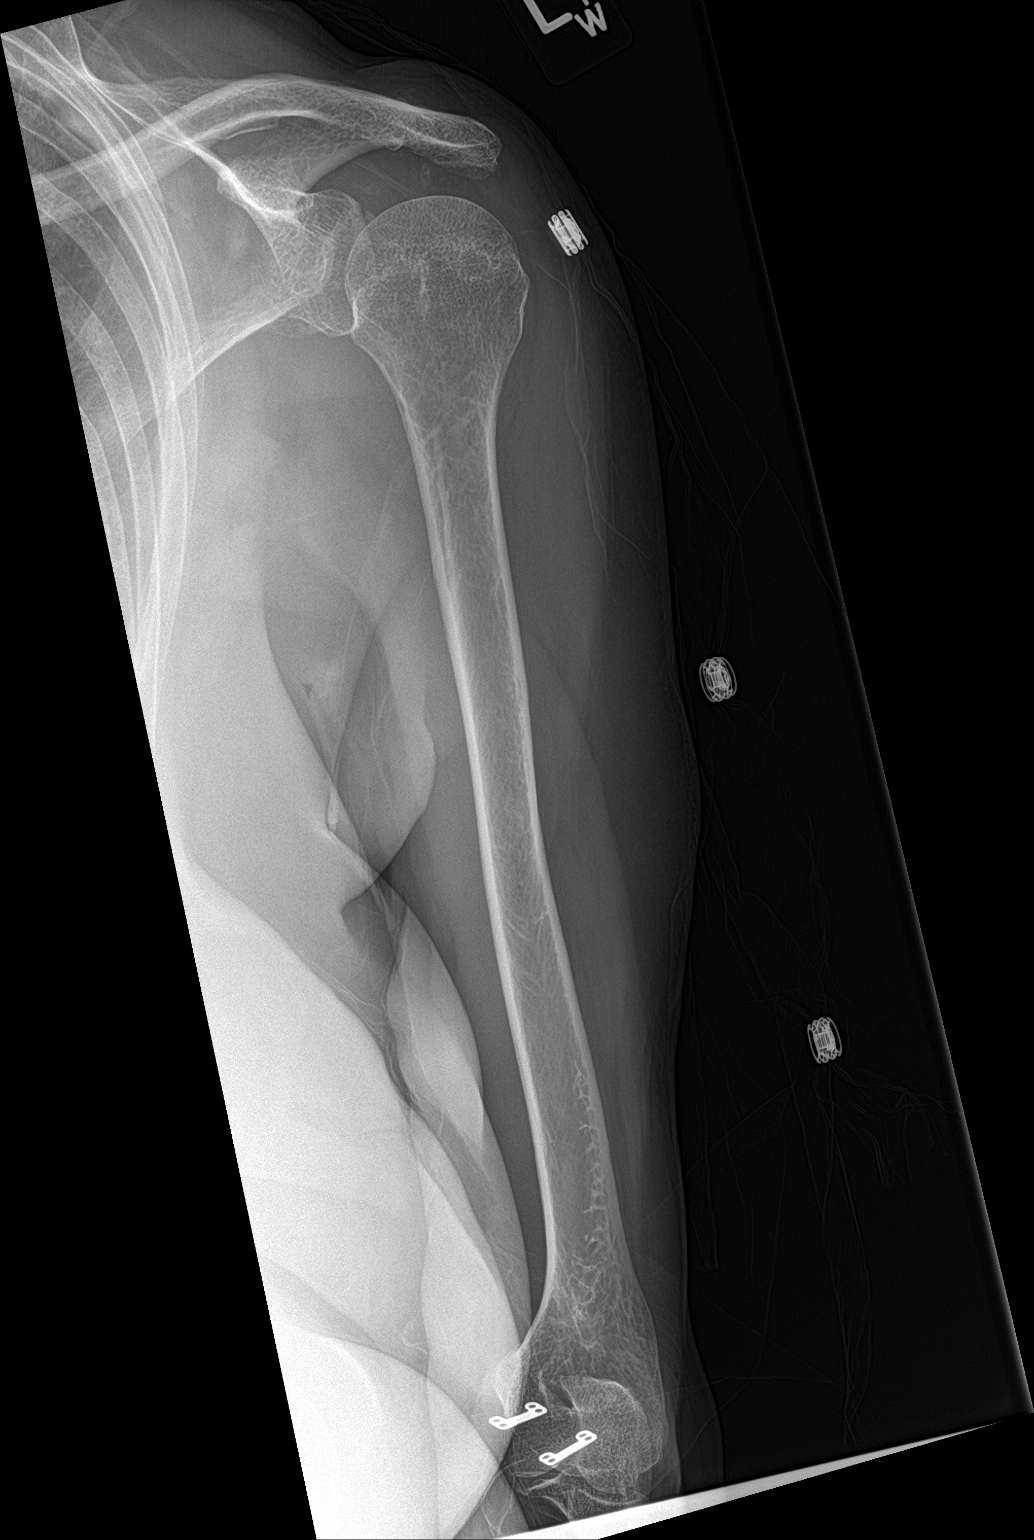

[humerus lat]
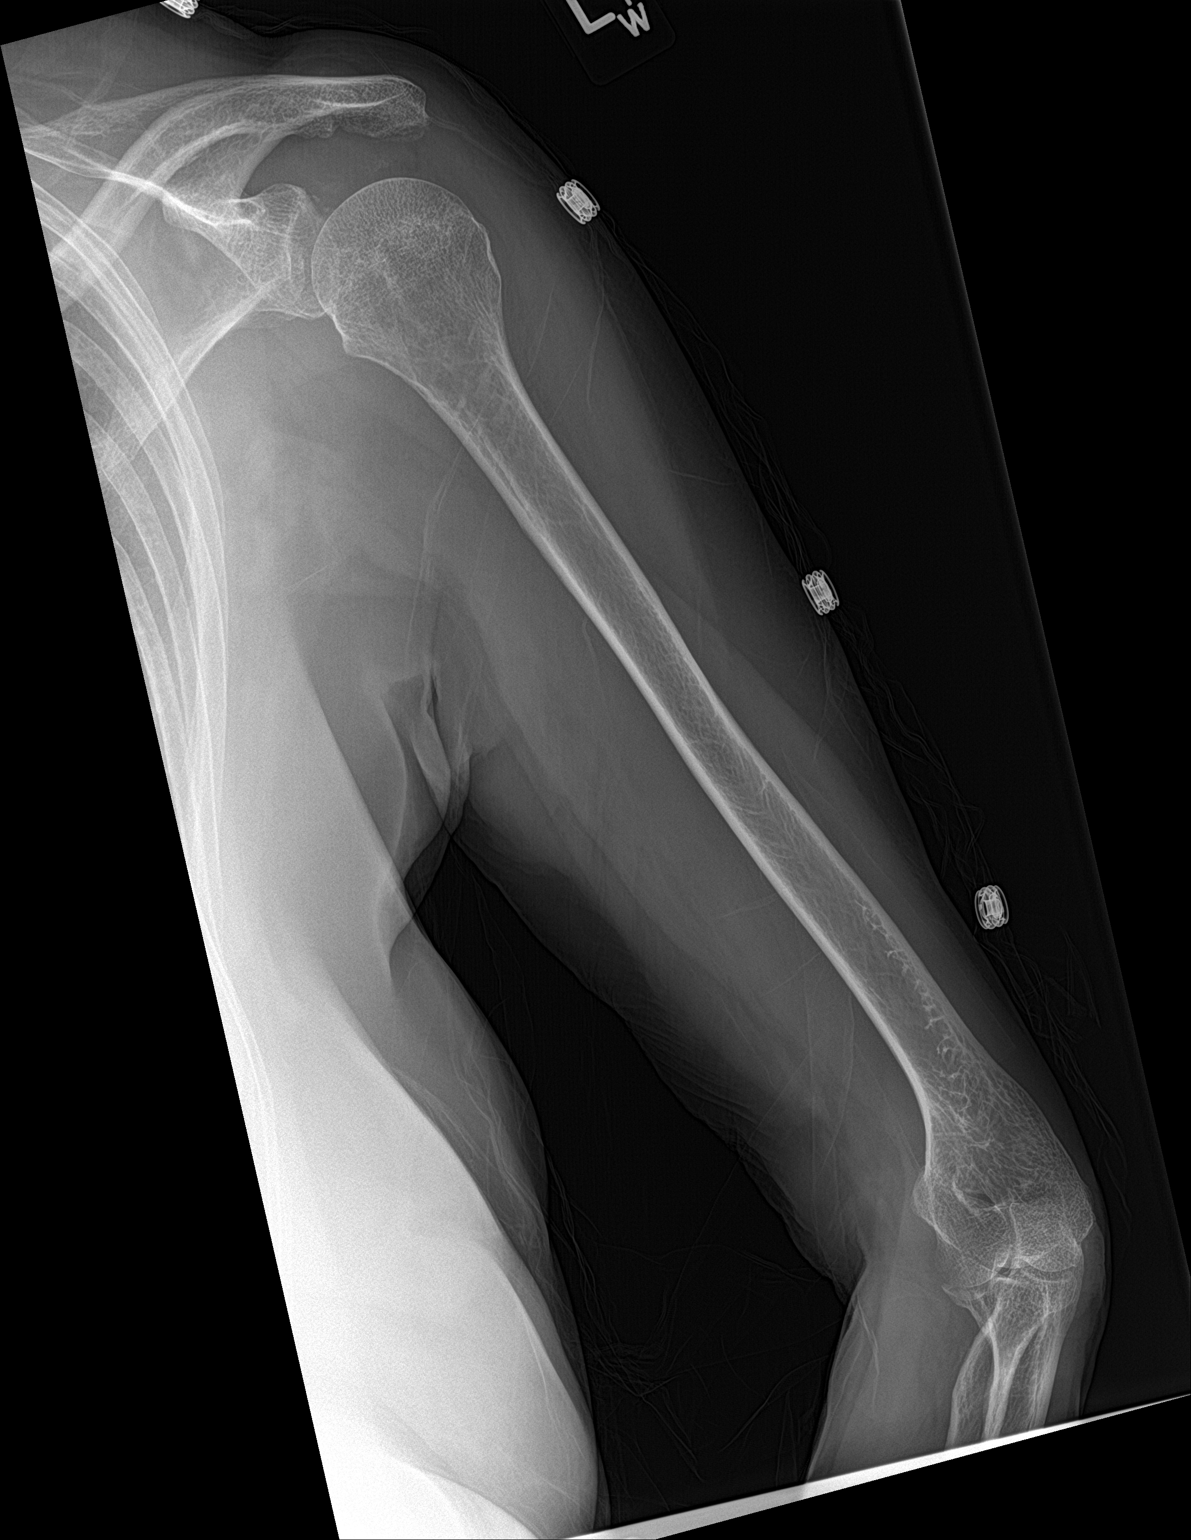

[2 of 2 positions shown; findings below may reference images not displayed]

FINDINGS: No fracture dislocation about the distal humerus.
IMPRESSION: No acute findings about the distal humerus. Please see shoulder
films, dictated separately.

## 2018-02-07 DIAGNOSIS — N182 Chronic kidney disease, stage 2 (mild): Secondary | ICD-10-CM | POA: Diagnosis not present

## 2018-02-07 DIAGNOSIS — E1142 Type 2 diabetes mellitus with diabetic polyneuropathy: Secondary | ICD-10-CM | POA: Diagnosis not present

## 2018-03-11 DIAGNOSIS — N182 Chronic kidney disease, stage 2 (mild): Secondary | ICD-10-CM | POA: Diagnosis not present

## 2018-03-11 DIAGNOSIS — Z23 Encounter for immunization: Secondary | ICD-10-CM | POA: Diagnosis not present

## 2018-03-11 DIAGNOSIS — E1142 Type 2 diabetes mellitus with diabetic polyneuropathy: Secondary | ICD-10-CM | POA: Diagnosis not present

## 2018-03-21 DIAGNOSIS — H401132 Primary open-angle glaucoma, bilateral, moderate stage: Secondary | ICD-10-CM | POA: Diagnosis not present

## 2018-03-21 DIAGNOSIS — H401122 Primary open-angle glaucoma, left eye, moderate stage: Secondary | ICD-10-CM | POA: Diagnosis not present

## 2018-03-21 DIAGNOSIS — H53411 Scotoma involving central area, right eye: Secondary | ICD-10-CM | POA: Diagnosis not present

## 2018-03-21 DIAGNOSIS — Z961 Presence of intraocular lens: Secondary | ICD-10-CM | POA: Diagnosis not present

## 2018-03-21 DIAGNOSIS — H3582 Retinal ischemia: Secondary | ICD-10-CM | POA: Diagnosis not present

## 2018-03-21 DIAGNOSIS — E113553 Type 2 diabetes mellitus with stable proliferative diabetic retinopathy, bilateral: Secondary | ICD-10-CM | POA: Diagnosis not present

## 2018-03-21 DIAGNOSIS — H401113 Primary open-angle glaucoma, right eye, severe stage: Secondary | ICD-10-CM | POA: Diagnosis not present

## 2018-04-19 DIAGNOSIS — E1142 Type 2 diabetes mellitus with diabetic polyneuropathy: Secondary | ICD-10-CM | POA: Diagnosis not present

## 2018-04-19 DIAGNOSIS — Z1331 Encounter for screening for depression: Secondary | ICD-10-CM | POA: Diagnosis not present

## 2018-04-19 DIAGNOSIS — N182 Chronic kidney disease, stage 2 (mild): Secondary | ICD-10-CM | POA: Diagnosis not present

## 2018-04-19 DIAGNOSIS — Z0001 Encounter for general adult medical examination with abnormal findings: Secondary | ICD-10-CM | POA: Diagnosis not present

## 2018-04-19 DIAGNOSIS — R739 Hyperglycemia, unspecified: Secondary | ICD-10-CM | POA: Diagnosis not present

## 2018-04-19 DIAGNOSIS — E119 Type 2 diabetes mellitus without complications: Secondary | ICD-10-CM | POA: Diagnosis not present

## 2018-04-19 DIAGNOSIS — Z1389 Encounter for screening for other disorder: Secondary | ICD-10-CM | POA: Diagnosis not present

## 2018-04-19 DIAGNOSIS — E1165 Type 2 diabetes mellitus with hyperglycemia: Secondary | ICD-10-CM | POA: Diagnosis not present

## 2018-04-19 DIAGNOSIS — I10 Essential (primary) hypertension: Secondary | ICD-10-CM | POA: Diagnosis not present

## 2018-04-19 DIAGNOSIS — E669 Obesity, unspecified: Secondary | ICD-10-CM | POA: Diagnosis not present

## 2018-04-19 DIAGNOSIS — Z0000 Encounter for general adult medical examination without abnormal findings: Secondary | ICD-10-CM | POA: Diagnosis not present

## 2018-04-19 DIAGNOSIS — E039 Hypothyroidism, unspecified: Secondary | ICD-10-CM | POA: Diagnosis not present

## 2018-04-19 DIAGNOSIS — E785 Hyperlipidemia, unspecified: Secondary | ICD-10-CM | POA: Diagnosis not present

## 2018-04-19 DIAGNOSIS — Z23 Encounter for immunization: Secondary | ICD-10-CM | POA: Diagnosis not present

## 2018-05-02 ENCOUNTER — Ambulatory Visit: Payer: Medicare HMO | Admitting: Podiatry

## 2018-05-02 ENCOUNTER — Encounter: Payer: Self-pay | Admitting: Podiatry

## 2018-05-02 VITALS — BP 153/68

## 2018-05-02 DIAGNOSIS — B351 Tinea unguium: Secondary | ICD-10-CM | POA: Diagnosis not present

## 2018-05-02 DIAGNOSIS — M79675 Pain in left toe(s): Secondary | ICD-10-CM | POA: Diagnosis not present

## 2018-05-02 DIAGNOSIS — M79674 Pain in right toe(s): Secondary | ICD-10-CM

## 2018-05-02 DIAGNOSIS — E1142 Type 2 diabetes mellitus with diabetic polyneuropathy: Secondary | ICD-10-CM

## 2018-05-02 NOTE — Patient Instructions (Signed)

## 2018-05-21 ENCOUNTER — Encounter: Payer: Self-pay | Admitting: Podiatry

## 2018-05-21 NOTE — Progress Notes (Signed)
Subjective: Annette Fitzgerald presents accompanied by her daughter, Annette Fitzgerald,  referred by Avon GullyFanta, Tesfaye, MD with cc of painful, discolored, thick toenails which interfere with daily activities.  Pain is aggravated when wearing enclosed shoe gear.   Past Medical History:  Diagnosis Date  . Diabetes mellitus without complication (HCC)   . Hypertension     There are no active problems to display for this patient.   Past Surgical History:  Procedure Laterality Date  . CATARACT EXTRACTION       Current Outpatient Medications:  .  ALPRAZolam (XANAX) 1 MG tablet, Take by mouth., Disp: , Rfl:  .  amLODipine (NORVASC) 5 MG tablet, Take by mouth., Disp: , Rfl:  .  aspirin 81 MG chewable tablet, Chew by mouth., Disp: , Rfl:  .  atorvastatin (LIPITOR) 10 MG tablet, Take by mouth., Disp: , Rfl:  .  cholecalciferol (VITAMIN D) 25 MCG (1000 UT) tablet, Take by mouth., Disp: , Rfl:  .  donepezil (ARICEPT) 10 MG tablet, Take by mouth., Disp: , Rfl:  .  gabapentin (NEURONTIN) 300 MG capsule, Take by mouth., Disp: , Rfl:  .  glipiZIDE (GLUCOTROL XL) 10 MG 24 hr tablet, Take by mouth., Disp: , Rfl:  .  hydrALAZINE (APRESOLINE) 25 MG tablet, TAKE 1 TABLET BY MOUTH TWICE DAILY, Disp: , Rfl:  .  insulin glargine (LANTUS) 100 UNIT/ML injection, Inject into the skin., Disp: , Rfl:  .  iron polysaccharides (NU-IRON) 150 MG capsule, Take by mouth., Disp: , Rfl:  .  metFORMIN (GLUCOPHAGE) 500 MG tablet, Take by mouth., Disp: , Rfl:  .  sulfamethoxazole-trimethoprim (BACTRIM DS,SEPTRA DS) 800-160 MG tablet, Take by mouth., Disp: , Rfl:  .  ACCU-CHEK AVIVA PLUS test strip, , Disp: , Rfl:  .  ACCU-CHEK SOFTCLIX LANCETS lancets, , Disp: , Rfl:  .  Alcohol Swabs (B-D SINGLE USE SWABS REGULAR) PADS, , Disp: , Rfl:  .  alendronate (FOSAMAX) 70 MG tablet, , Disp: , Rfl:  .  amLODipine (NORVASC) 10 MG tablet, , Disp: , Rfl:  .  Insulin Syringe-Needle U-100 (INSULIN SYRINGE .5CC/30GX5/16") 30G X 5/16" 0.5 ML MISC,  USE FOR LANTUS INJECTIONS QHS, Disp: , Rfl: 3 .  JANUVIA 100 MG tablet, , Disp: , Rfl:  .  latanoprost (XALATAN) 0.005 % ophthalmic solution, , Disp: , Rfl:  .  losartan (COZAAR) 25 MG tablet, , Disp: , Rfl:  .  losartan-hydrochlorothiazide (HYZAAR) 50-12.5 MG tablet, , Disp: , Rfl:   No Known Allergies  Social History   Occupational History  . Not on file  Tobacco Use  . Smoking status: Never Smoker  . Smokeless tobacco: Never Used  Substance and Sexual Activity  . Alcohol use: No  . Drug use: No  . Sexual activity: Not on file    No family history on file.   There is no immunization history on file for this patient.   Review of systems: Positive Findings in bold print.  Constitutional:  chills, fatigue, fever, sweats, weight change Communication: Nurse, learning disabilitytranslator, sign Presenter, broadcastinglanguage translator, hand writing, iPad/Android device Eyes: diplopia, glare,  light sensitivity, eyeglasses, blindness Ears nose mouth throat: Hard of hearing, deaf, sign language,  vertigo,   bloody nose,  rhinitis,  cold sores, snoring Cardiovascular: HTN, edema, arrhythmia, pacemaker in place, defibrillator in place,  chest pain/tightness, chronic anticoagulation, blood clot Respiratory:  difficulty breathing, denies congestion, SOB, wheezing, cough Gastrointestinal: abdominal pain, diarrhea, nausea, vomiting,  Genitourinary:  nocturia,  pain on urination,  blood in urine, Foley  catheter, urinary urgency Musculoskeletal: Uses mobility aid,  cramping, stiff joints, painful joints,  Skin: +changes in toenails, color change dryness, itchy skin, mole changes, or rash  Neurological: numbness, paresthesias, burning in feet, denies fainting,  seizure, change in speech. denies headaches, memory problems/poor historian, cerebral palsy Endocrine: diabetes, hypothyroidism, hyperthyroidism,  dry mouth, flushing, denies heat intolerance,  cold intolerance,  excessive thirst, denies polyuria,  nocturia Hematological:  easy  bleeding,  excessive bleeding, easy bruising, enlarged lymph nodes, on long term blood thinner Allergy/immunological:  hive, frequent infections, multiple drug allergies, seasonal allergies,  Psychiatric:  anxiety, depression, mood disorder, suicidal ideations, hallucinations   Objective: Vascular Examination: Capillary refill time immediate x 10 digits Dorsalis pedis and posterior tibial pulses present b/l No digital hair x 10 digits Skin temperature gradient WNL b/l  Dermatological Examination: Skin with normal turgor, texture and tone b/l  Toenails 1-5 b/l discolored, thick, dystrophic with subungual debris and pain with palpation to nailbeds due to thickness of nails.  Musculoskeletal: Muscle strength 5/5 to all LE muscle groups  Neurological: Sensation intact with 10 gram monofilament Vibratory sensation intact.  Assessment: 1. Painful onychomycosis toenails 1-5 b/l  2. NIDDM with neuropathy   Plan: 1. Discussed onychomycosis and treatment options.  Literature dispensed on today. 2. Toenails 1-5 b/l were debrided in length and girth without iatrogenic bleeding. 3. Patient to continue soft, supportive shoe gear 4. Patient to report any pedal injuries to medical professional immediately. 5. Follow up 3 months. Patient/POA to call should there be a concern in the interim.

## 2018-06-12 DIAGNOSIS — I10 Essential (primary) hypertension: Secondary | ICD-10-CM | POA: Diagnosis not present

## 2018-06-12 DIAGNOSIS — E1142 Type 2 diabetes mellitus with diabetic polyneuropathy: Secondary | ICD-10-CM | POA: Diagnosis not present

## 2018-07-13 DIAGNOSIS — N182 Chronic kidney disease, stage 2 (mild): Secondary | ICD-10-CM | POA: Diagnosis not present

## 2018-07-13 DIAGNOSIS — E1142 Type 2 diabetes mellitus with diabetic polyneuropathy: Secondary | ICD-10-CM | POA: Diagnosis not present

## 2018-08-01 ENCOUNTER — Other Ambulatory Visit: Payer: Self-pay

## 2018-08-01 ENCOUNTER — Encounter: Payer: Self-pay | Admitting: Podiatry

## 2018-08-01 ENCOUNTER — Ambulatory Visit: Payer: Medicare HMO | Admitting: Podiatry

## 2018-08-01 DIAGNOSIS — M79674 Pain in right toe(s): Secondary | ICD-10-CM

## 2018-08-01 DIAGNOSIS — B351 Tinea unguium: Secondary | ICD-10-CM

## 2018-08-01 DIAGNOSIS — M79675 Pain in left toe(s): Secondary | ICD-10-CM

## 2018-08-01 DIAGNOSIS — E1142 Type 2 diabetes mellitus with diabetic polyneuropathy: Secondary | ICD-10-CM | POA: Diagnosis not present

## 2018-08-01 NOTE — Progress Notes (Signed)
Subjective: Annette Fitzgerald presents today with history of neuropathy with cc of painful, mycotic toenails.  Pain is aggravated when wearing enclosed shoe gear and relieved with periodic professional debridement.  Patient has peripheral neuropathy managed with gabapentin.  Avon Gully, MD is her PCP.    Current Outpatient Medications:  .  ACCU-CHEK AVIVA PLUS test strip, , Disp: , Rfl:  .  ACCU-CHEK SOFTCLIX LANCETS lancets, , Disp: , Rfl:  .  Alcohol Swabs (B-D SINGLE USE SWABS REGULAR) PADS, , Disp: , Rfl:  .  alendronate (FOSAMAX) 70 MG tablet, , Disp: , Rfl:  .  ALPRAZolam (XANAX) 1 MG tablet, Take by mouth., Disp: , Rfl:  .  amLODipine (NORVASC) 10 MG tablet, , Disp: , Rfl:  .  amLODipine (NORVASC) 5 MG tablet, Take by mouth., Disp: , Rfl:  .  aspirin 81 MG chewable tablet, Chew by mouth., Disp: , Rfl:  .  atorvastatin (LIPITOR) 10 MG tablet, Take by mouth., Disp: , Rfl:  .  cholecalciferol (VITAMIN D) 25 MCG (1000 UT) tablet, Take by mouth., Disp: , Rfl:  .  donepezil (ARICEPT) 10 MG tablet, Take by mouth., Disp: , Rfl:  .  gabapentin (NEURONTIN) 300 MG capsule, Take by mouth., Disp: , Rfl:  .  glipiZIDE (GLUCOTROL XL) 10 MG 24 hr tablet, Take by mouth., Disp: , Rfl:  .  hydrALAZINE (APRESOLINE) 25 MG tablet, TAKE 1 TABLET BY MOUTH TWICE DAILY, Disp: , Rfl:  .  insulin glargine (LANTUS) 100 UNIT/ML injection, Inject into the skin., Disp: , Rfl:  .  Insulin Syringe-Needle U-100 (INSULIN SYRINGE .5CC/30GX5/16") 30G X 5/16" 0.5 ML MISC, USE FOR LANTUS INJECTIONS QHS, Disp: , Rfl: 3 .  iron polysaccharides (NU-IRON) 150 MG capsule, Take by mouth., Disp: , Rfl:  .  JANUVIA 100 MG tablet, , Disp: , Rfl:  .  latanoprost (XALATAN) 0.005 % ophthalmic solution, , Disp: , Rfl:  .  losartan (COZAAR) 25 MG tablet, , Disp: , Rfl:  .  losartan-hydrochlorothiazide (HYZAAR) 50-12.5 MG tablet, , Disp: , Rfl:  .  metFORMIN (GLUCOPHAGE) 500 MG tablet, Take by mouth., Disp: , Rfl:  .   sulfamethoxazole-trimethoprim (BACTRIM DS,SEPTRA DS) 800-160 MG tablet, Take by mouth., Disp: , Rfl:  .  timolol (TIMOPTIC) 0.25 % ophthalmic solution, PLACE 1 DROP INTO BOTH EYES BID, Disp: , Rfl:   No Known Allergies  Objective:  Vascular Examination: Capillary refill time immediate x 10 digits  Dorsalis pedis and Posterior tibial pulses palpable b/l  Digital hair x 10 digits was absent  Skin temperature gradient WNL b/l  Dermatological Examination: Skin with normal turgor, texture and tone b/l  Toenails 1-5 b/l discolored, thick, dystrophic with subungual debris and pain with palpation to nailbeds due to thickness of nails.  Musculoskeletal: Muscle strength 5/5 to all muscle groups b/l  Neurological: Sensation with 10 gram monofilament is intact b/l  Vibratory sensation intact b/l  Assessment: 1. Painful onychomycosis toenails 1-5 b/l 2. NIDDM with neuropathy  Plan: 1. Toenails 1-5 b/l were debrided in length and girth without iatrogenic bleeding. 2. Patient to continue soft, supportive shoe gear 3. Patient to report any pedal injuries to medical professional  4. Follow up 3 months.  5. Patient/POA to call should there be a concern in the interim.

## 2018-08-01 NOTE — Patient Instructions (Signed)

## 2018-08-03 DIAGNOSIS — R809 Proteinuria, unspecified: Secondary | ICD-10-CM | POA: Diagnosis not present

## 2018-08-03 DIAGNOSIS — Z79899 Other long term (current) drug therapy: Secondary | ICD-10-CM | POA: Diagnosis not present

## 2018-08-03 DIAGNOSIS — D509 Iron deficiency anemia, unspecified: Secondary | ICD-10-CM | POA: Diagnosis not present

## 2018-08-03 DIAGNOSIS — I10 Essential (primary) hypertension: Secondary | ICD-10-CM | POA: Diagnosis not present

## 2018-08-03 DIAGNOSIS — N183 Chronic kidney disease, stage 3 (moderate): Secondary | ICD-10-CM | POA: Diagnosis not present

## 2018-08-03 DIAGNOSIS — E559 Vitamin D deficiency, unspecified: Secondary | ICD-10-CM | POA: Diagnosis not present

## 2018-08-08 DIAGNOSIS — N183 Chronic kidney disease, stage 3 (moderate): Secondary | ICD-10-CM | POA: Diagnosis not present

## 2018-08-08 DIAGNOSIS — E1129 Type 2 diabetes mellitus with other diabetic kidney complication: Secondary | ICD-10-CM | POA: Diagnosis not present

## 2018-08-08 DIAGNOSIS — D649 Anemia, unspecified: Secondary | ICD-10-CM | POA: Diagnosis not present

## 2018-08-08 DIAGNOSIS — I10 Essential (primary) hypertension: Secondary | ICD-10-CM | POA: Diagnosis not present

## 2018-08-11 DIAGNOSIS — N182 Chronic kidney disease, stage 2 (mild): Secondary | ICD-10-CM | POA: Diagnosis not present

## 2018-08-11 DIAGNOSIS — E1142 Type 2 diabetes mellitus with diabetic polyneuropathy: Secondary | ICD-10-CM | POA: Diagnosis not present

## 2018-09-11 DIAGNOSIS — E1142 Type 2 diabetes mellitus with diabetic polyneuropathy: Secondary | ICD-10-CM | POA: Diagnosis not present

## 2018-09-11 DIAGNOSIS — I10 Essential (primary) hypertension: Secondary | ICD-10-CM | POA: Diagnosis not present

## 2018-09-19 ENCOUNTER — Other Ambulatory Visit: Payer: Self-pay

## 2018-09-19 NOTE — Patient Outreach (Signed)
[facility]) Care Management  09/19/2018  Annette Fitzgerald [birth year] [id number]   Telephone Screen  Referral Date: 09/19/2018 Referral Source: MD Office Referral Reason: " DM, Renal failure, patient needs financial assistance with medication" Insurance: [employer]   Outreach attempt # 1 to patient. A female answered the phone but dud not identify herself. RN CM asked to speak with patient and she reported patient was not available at present. RN CM left contact info for patient.      Plan: RN CM will make outreach attempt to patient within 3-4 business days. RN CM will send unsuccessful outreach letter to patient.   [name], RN,BSN,CCM [facility] Care Management Telephonic Care Management Coordinator Direct Phone: [phone] Toll Free: [phone] Fax: [phone]

## 2018-09-21 ENCOUNTER — Other Ambulatory Visit: Payer: Self-pay

## 2018-09-21 NOTE — Patient Outreach (Signed)
[facility]) Care Management  09/21/2018  Annette Fitzgerald December 27, 1932 [id number]   Telephone Screen  Referral Date: 09/19/2018 Referral Source: MD Office Referral Reason: " DM, Renal failure, patient needs financial assistance with medication" Insurance: [employer]   Outreach attempt #2 to patient. Spoke with daughter who reported patient was still asleep. RN CM left name and contact info for patient to return call once she wakes up.      Plan: RN CM will make outreach attempt to patient within 3-4 business days.    [name], RN,BSN,CCM [facility] Care Management Telephonic Care Management Coordinator Direct Phone: [phone] Toll Free: [phone] Fax: [phone]

## 2018-09-21 NOTE — Patient Outreach (Signed)
[facility]) Care Management  09/21/2018  LESLE CAMPTON 06/18/1932 [id number]   Telephone Screen  Referral Date:09/19/2018 Referral Source:MD Office Referral Reason:" DM, Renal failure, patient needs financial assistance with medication" Insurance:[employer]  Incoming call from patient and her daughter. patient gave verbal consent for RN CM to speak with her daughter as well. Patient resides in the home along with her daughter. She is independent with ADLs and IADLs. No recent falls and denies uses of assistive devices. Per chart review, patient has PMH of CKD, HTN and DM.Marland Kitchen Daughter is sole caregiver and assists patient with managing chronic conditions. She is checking patient's cbgs 2x/day and reports readings of 90's to mid 100's. Discussed referral source and reason with daughter and patient. Daughter is filling med planner for patient and helping patient keep track of meds. She reports patient taking more than 10 meds but unable to provide count. She states that most of her meds come from [facility] mail order. However, due to cost patient unable to get Lantus insulin and Tradgenta via mail order. They are using local pharmacy and daughter reports some financial hardship with affording meds. She voices that MD office had been able to provide them with samples for some time of both meds but due to virus outbreak and no drug reps coming to MD office the office does not have samples and patient will run out of meds soon. Daughter also reports that patient is supposed to be taking [place] eye gtts but due to cost has been unable to afford it and has been using "OTC eye gtts" as a supplement. Daughter and family provides transportation to appts for patient. Reminded daughter of transportation benefit via [employer]. She denies any further RN CM or [facility]N needs or concerns at this time. Verbal consent for [facility] services provided by daughter.   Plan: RN CM will send [facility] pharmacy referral  for possible med assistance and polypharmacy med review.   [name], RN,BSN,CCM [facility] Care Management Telephonic Care Management Coordinator Direct Phone: [phone] Toll Free: [phone] Fax: [phone]

## 2018-09-22 ENCOUNTER — Other Ambulatory Visit: Payer: Self-pay | Admitting: Pharmacist

## 2018-09-22 ENCOUNTER — Ambulatory Visit: Payer: Self-pay | Admitting: Pharmacist

## 2018-09-22 NOTE — Patient Outreach (Signed)
[facility]) Care Management  [name]   09/22/2018  Annette Fitzgerald 1932/08/11 [id number]  Reason for referral: Medication Assistance with insulin & DM meds  Referral source: [facility] Current insurance: [employer]  PMHx includes but not limited to:  T2DM, HLD, HTN  Outreach:  Successful telephone call with Annette Fitzgerald & daughter.  HIPAA identifiers verified.  Patient granted permission for Annette Fitzgerald to speak with daughter.  She states daughter takes care of all of her medications.  Dr. Legrand Fitzgerald is her PCP and she sees Dr. Delrae Fitzgerald for her kidneys (both providers are in [place], [place]).  Will obtain full medication list from their offices.  Patient is having trouble paying for insulin.  She would like to have the insulin pens since her dose is difficult to draw up.  Her current dose is 40 units in the evening.  Patient and daughter are agreeable to participate in Patient Assistance Programs.  Objective:  BP Readings from Last 3 Encounters:  05/02/18 (!) 153/68  04/26/16 162/64   Medications Reviewed Today    Reviewed by Annette Fitzgerald, Jolly (Pharmacist) on 09/22/18 at 1425  Med List Status: <None>  Medication Order Taking? Sig Documenting Provider Last Dose Status Informant  ACCU-CHEK AVIVA PLUS test strip [id number] Yes  [provider] Taking Active   ACCU-CHEK SOFTCLIX LANCETS lancets [id number] Yes  [provider] Taking Active   Alcohol Swabs (B-D SINGLE USE SWABS REGULAR) PADS [id number] Yes  [provider] Taking Active   alendronate (FOSAMAX) 70 MG tablet [id number] Yes  [provider] Taking Active   ALPRAZolam Duanne Moron) 1 MG tablet [id number] Yes Take by mouth. [provider] Taking Active   amLODipine (NORVASC) 10 MG tablet [id number] Yes  [provider] Taking Active   aspirin 81 MG chewable tablet [id number] Yes Chew by mouth. [provider] Taking Active   atorvastatin (LIPITOR) 10 MG tablet  [id number] Yes Take by mouth. [provider] Taking Active   cholecalciferol (VITAMIN D) 25 MCG (1000 UT) tablet [id number] Yes Take by mouth. [provider] Taking Active   donepezil (ARICEPT) 10 MG tablet [id number] Yes Take by mouth. [provider] Taking Active   gabapentin (NEURONTIN) 300 MG capsule [id number] Yes Take by mouth. [provider] Taking Active   hydrALAZINE (APRESOLINE) 25 MG tablet [id number] Yes TAKE 1 TABLET BY MOUTH TWICE DAILY [provider] Taking Active   insulin glargine (LANTUS) 100 UNIT/ML injection [id number] Yes Inject 30 Units into the skin at bedtime.  [provider] Taking Active   Insulin Syringe-Needle U-100 (INSULIN SYRINGE .5CC/30GX5/16") 30G X 5/16" 0.5 ML MISC [id number]  USE FOR LANTUS INJECTIONS QHS [provider]  Active   iron polysaccharides (NU-IRON) 150 MG capsule [id number] Yes Take by mouth. [provider] Taking Active   latanoprost (XALATAN) 0.005 % ophthalmic solution [id number] Yes  [provider] Taking Active   linagliptin (TRADJENTA) 5 MG TABS tablet [id number] Yes Take 5 mg by mouth daily. [provider] Taking Active         Discontinued 09/22/18 1425 (Change in therapy)   losartan (COZAAR) 25 MG tablet [id number] Yes Take 12.5 mg by mouth daily. [provider] Taking Active         Discontinued 09/22/18 1425 (Completed Course)   metFORMIN (GLUCOPHAGE) 500 MG tablet [id number]  Take by mouth. [provider]  Active         Discontinued 09/22/18 1425 (Completed Course)  timolol (TIMOPTIC) 0.25 % ophthalmic solution [id number] Yes PLACE 1 DROP INTO BOTH EYES BID [provider] Taking Active           Assessment:  Drugs sorted by system:  Neurologic/Psychologic:  Cardiovascular:  Pulmonary/Allergy:  Gastrointestinal:  Endocrine:  Renal:  Topical:  Pain:  Infectious  Diseases:  Oncology:  Genitourinary:  Vitamins/Minerals/Supplements:  Miscellaneous:  Medication Review Findings:  . Will reconcile full medication lists once received from providers. . Daughter takes care or all medications   Medication Assistance Findings:  Medication assistance needs identified.   Extra Help:  Not eligible for Extra Help Low Income Subsidy based on reported income and assets  Patient Assistance Programs: Engineer, agricultural and Cytogeneticist made by [place] requirement met: Yes o Out-of-pocket prescription expenditure met:   Not Applicable - Patient has met application requirements to apply for this patient assistance program.     Plan: . I will route patient assistance letter to [place] technician who will coordinate patient assistance program application process for medications listed above.  [facility] pharmacy technician will assist with obtaining all required documents from both patient and provider(s) and submit application(s) once completed.  . Will contact Dr. Legrand Fitzgerald regarding new BG meter and Lantus-->Basaglar switch.   . Will contact providers for medication lists and will follow up within two weeks to reconcile list   Annette Fitzgerald, PharmD, [place]  [phone]

## 2018-09-23 ENCOUNTER — Other Ambulatory Visit: Payer: Self-pay

## 2018-09-23 ENCOUNTER — Other Ambulatory Visit: Payer: Self-pay | Admitting: Pharmacy Technician

## 2018-09-23 NOTE — Patient Outreach (Signed)
[facility]) Care Management  09/23/2018  Annette Fitzgerald April 17, 1933 [id number]   Successful outreach to patient's daughter regarding social work referral for "shower bar or bath bar for patient's safety while bathing".  BSW talked with daughter about [employer] OTC Catalog as there is a "tub bar" product listed.  BSW agreed to [employer]. BSW educated patient about [employer] [employer] and Independent Living which could both assist with grab bar installation in the home.  BSW offered to submit referrals but daughter requested that information about these services be mailed to her first.  BSW will follow up with her within the next two weeks to ensure receipt and submit referrals if requested.  BSW did inform her that process for home modifications is lengthier at this time due to COVID19.  Malachy Chamber, BSW Social Worker [phone]

## 2018-09-23 NOTE — Patient Outreach (Signed)
[facility]) Care Management  09/23/2018  Annette Fitzgerald Oct 28, 1932 [id number]                          Medication Assistance Referral  Referral From: [facility] RPh Kandra Nicolas  Medication/Company: [name] / [employer] Patient application portion:  Mailed Provider application portion: Faxed  to Dr. Felecia Shelling  Medication/Company: [name]  / [name] Cares Patient application portion:  Mailed Provider application portion: Faxed  to Dr. Felecia Shelling   Follow up:  Will follow up with patient in 7-10 business days to confirm application(s) have been received.  [name] Effie Shy CPhT Certified Pharmacy Technician [facility] Care Management Direct Dial:[phone]

## 2018-10-03 ENCOUNTER — Other Ambulatory Visit: Payer: Self-pay

## 2018-10-03 NOTE — Patient Outreach (Signed)
[facility]) Care Management  10/03/2018  BLEN HERTER 1933-02-18 [id number]  Successful follow up call to patient's daughter regarding resources mailed on 09/23/18.  Daughter confirmed receipt of [employer] OTC Catalog as well as information about [place] Doctor, general practice and Independent Living.  Daughter reported that she intends to call [employer] to request assistance with installation of grab bars in the home. BSW encouraged her to call as soon as she can as process is delayed right now due to COVID19.  BSW is closing case but encouraged daughter to call if additional needs or questions arise.  Malachy Chamber, BSW Social Worker [phone]

## 2018-10-17 ENCOUNTER — Ambulatory Visit: Payer: Self-pay | Admitting: Pharmacist

## 2018-10-17 ENCOUNTER — Other Ambulatory Visit: Payer: Self-pay | Admitting: Pharmacist

## 2018-10-17 NOTE — Patient Outreach (Signed)
[facility]) Care Management [facility]  10/17/2018  Annette Fitzgerald 26-Apr-1933 [id number]  Reason for referral: medication management/assistance  Successful care coordination call to patient's daughter with HIPAA identifiers verified x2.  She stated PAP forms have been mailed back.  EOB sent to [facility] CPhT, Lilla Shook.  Patient is doing well, no needs identified at this time.    PLAN: -Will continue to follow as needed  Kieth Brightly, PharmD, [facility] Clinical Pharmacist [facility] [employer]  [phone]

## 2018-10-20 DIAGNOSIS — E1142 Type 2 diabetes mellitus with diabetic polyneuropathy: Secondary | ICD-10-CM | POA: Diagnosis not present

## 2018-10-20 DIAGNOSIS — I70234 Atherosclerosis of native arteries of right leg with ulceration of heel and midfoot: Secondary | ICD-10-CM | POA: Diagnosis not present

## 2018-10-20 DIAGNOSIS — I10 Essential (primary) hypertension: Secondary | ICD-10-CM | POA: Diagnosis not present

## 2018-10-20 DIAGNOSIS — G301 Alzheimer's disease with late onset: Secondary | ICD-10-CM | POA: Diagnosis not present

## 2018-10-21 ENCOUNTER — Other Ambulatory Visit: Payer: Self-pay | Admitting: Pharmacy Technician

## 2018-10-21 NOTE — Patient Outreach (Signed)
[facility]) Care Management  10/21/2018  Annette Fitzgerald 09-Nov-1932 [id number]   Received patient portion(s) of patient assistance application for [employer] and Tradjenta. Faxed completed application and required documents into [employer] for [employer].  Refaxed provider portion of Tradjenta to Dr. Felecia Shelling for completion.  Will follow up with [employer] in 2-3 business days to check status of application.  Will submit Tradjenta application to [employer] once provider documents have been received.  [name] Effie Shy CPhT Certified Pharmacy Technician [facility] Care Management Direct [phone]

## 2018-10-26 ENCOUNTER — Other Ambulatory Visit: Payer: Self-pay | Admitting: Pharmacy Technician

## 2018-10-26 NOTE — Patient Outreach (Signed)
[facility]) Care Management  10/26/2018  JANANI LANGWELL May 17, 1933 [id number]    Follow up call placed to [facility] regarding patient assistance application(s) for Buena Irish confirms patient has been approved as of 5/20 until 06/08/19.   Follow up:  Will follow up with [facility] in 5-7 business days to check shipping details.  [name] Effie Shy CPhT Certified Pharmacy Technician [facility] Care Management Direct Dial:[phone]

## 2018-11-01 ENCOUNTER — Ambulatory Visit: Payer: Medicare HMO | Admitting: Podiatry

## 2018-11-01 ENCOUNTER — Other Ambulatory Visit: Payer: Self-pay

## 2018-11-01 ENCOUNTER — Encounter: Payer: Self-pay | Admitting: Podiatry

## 2018-11-01 VITALS — Temp 97.5°F

## 2018-11-01 DIAGNOSIS — B351 Tinea unguium: Secondary | ICD-10-CM | POA: Diagnosis not present

## 2018-11-01 DIAGNOSIS — M79675 Pain in left toe(s): Secondary | ICD-10-CM

## 2018-11-01 DIAGNOSIS — M79674 Pain in right toe(s): Secondary | ICD-10-CM

## 2018-11-01 DIAGNOSIS — E1142 Type 2 diabetes mellitus with diabetic polyneuropathy: Secondary | ICD-10-CM | POA: Diagnosis not present

## 2018-11-01 NOTE — Patient Instructions (Signed)
Diabetes Mellitus and Foot Care  Foot care is an important part of your health, especially when you have diabetes. Diabetes may cause you to have problems because of poor blood flow (circulation) to your feet and legs, which can cause your skin to:   Become thinner and drier.   Break more easily.   Heal more slowly.   Peel and crack.  You may also have nerve damage (neuropathy) in your legs and feet, causing decreased feeling in them. This means that you may not notice minor injuries to your feet that could lead to more serious problems. Noticing and addressing any potential problems early is the best way to prevent future foot problems.  How to care for your feet  Foot hygiene   Wash your feet daily with warm water and mild soap. Do not use hot water. Then, pat your feet and the areas between your toes until they are completely dry. Do not soak your feet as this can dry your skin.   Trim your toenails straight across. Do not dig under them or around the cuticle. File the edges of your nails with an emery board or nail file.   Apply a moisturizing lotion or petroleum jelly to the skin on your feet and to dry, brittle toenails. Use lotion that does not contain alcohol and is unscented. Do not apply lotion between your toes.  Shoes and socks   Wear clean socks or stockings every day. Make sure they are not too tight. Do not wear knee-high stockings since they may decrease blood flow to your legs.   Wear shoes that fit properly and have enough cushioning. Always look in your shoes before you put them on to be sure there are no objects inside.   To break in new shoes, wear them for just a few hours a day. This prevents injuries on your feet.  Wounds, scrapes, corns, and calluses   Check your feet daily for blisters, cuts, bruises, sores, and redness. If you cannot see the bottom of your feet, use a mirror or ask someone for help.   Do not cut corns or calluses or try to remove them with medicine.   If you  find a minor scrape, cut, or break in the skin on your feet, keep it and the skin around it clean and dry. You may clean these areas with mild soap and water. Do not clean the area with peroxide, alcohol, or iodine.   If you have a wound, scrape, corn, or callus on your foot, look at it several times a day to make sure it is healing and not infected. Check for:  ? Redness, swelling, or pain.  ? Fluid or blood.  ? Warmth.  ? Pus or a bad smell.  General instructions   Do not cross your legs. This may decrease blood flow to your feet.   Do not use heating pads or hot water bottles on your feet. They may burn your skin. If you have lost feeling in your feet or legs, you may not know this is happening until it is too late.   Protect your feet from hot and cold by wearing shoes, such as at the beach or on hot pavement.   Schedule a complete foot exam at least once a year (annually) or more often if you have foot problems. If you have foot problems, report any cuts, sores, or bruises to your health care provider immediately.  Contact a health care provider if:     You have a medical condition that increases your risk of infection and you have any cuts, sores, or bruises on your feet.   You have an injury that is not healing.   You have redness on your legs or feet.   You feel burning or tingling in your legs or feet.   You have pain or cramps in your legs and feet.   Your legs or feet are numb.   Your feet always feel cold.   You have pain around a toenail.  Get help right away if:   You have a wound, scrape, corn, or callus on your foot and:  ? You have pain, swelling, or redness that gets worse.  ? You have fluid or blood coming from the wound, scrape, corn, or callus.  ? Your wound, scrape, corn, or callus feels warm to the touch.  ? You have pus or a bad smell coming from the wound, scrape, corn, or callus.  ? You have a fever.  ? You have a red line going up your leg.  Summary   Check your feet every day  for cuts, sores, red spots, swelling, and blisters.   Moisturize feet and legs daily.   Wear shoes that fit properly and have enough cushioning.   If you have foot problems, report any cuts, sores, or bruises to your health care provider immediately.   Schedule a complete foot exam at least once a year (annually) or more often if you have foot problems.  This information is not intended to replace advice given to you by your health care provider. Make sure you discuss any questions you have with your health care provider.  Document Released: 05/22/2000 Document Revised: 07/07/2017 Document Reviewed: 06/26/2016  Elsevier Interactive Patient Education  2019 Elsevier Inc.

## 2018-11-03 ENCOUNTER — Other Ambulatory Visit: Payer: Self-pay | Admitting: Pharmacist

## 2018-11-03 NOTE — Patient Outreach (Addendum)
[facility]) Care Management [facility]  11/03/2018  Annette Fitzgerald 1932-10-14 [id number]  Reason for referral: medication management/assistance  Case discussed with [facility] CPhT, Lilla Shook regarding patient assistance programs.  Phone number given to patient for [facility] [phone] (pharmacy dispensing Basaglar) to set up delivery to home.  Daughter verbalizes understanding and states she will call now.  Voicemail left with Dr. Felecia Shelling requesting pen needles for new Basaglar insulin pens.  Also, Trajenta portion of application not returned to [facility].  Requested update.  UPDATE:  Patient's daughter able to call [facility]-->insulin Psychologist, sport and exercise) will be delivered 11/04/18.  PLAN: -I will follow up in 1 week to ensure insulin delivery and supplies.  Kieth Brightly, PharmD, [facility] Clinical Pharmacist [facility]  [phone]

## 2018-11-06 ENCOUNTER — Encounter: Payer: Self-pay | Admitting: Podiatry

## 2018-11-06 NOTE — Progress Notes (Signed)
Subjective: Annette Fitzgerald presents today with history of diabetic neuropathy for follow up of chronic, painful mycotic toenails which interfere with daily activities and routine tasks.    Patient's daughter is present during the visit today.  Daughter voices no new concerns on today's visit.  She has noted subjective neuropathy secondary to diabetes is managed with gabapentin.  Avon Gully, MD is her PCP.  Her last visit was 1 month ago.   Current Outpatient Medications:  .  ACCU-CHEK AVIVA PLUS test strip, , Disp: , Rfl:  .  ACCU-CHEK SOFTCLIX LANCETS lancets, , Disp: , Rfl:  .  Alcohol Swabs (B-D SINGLE USE SWABS REGULAR) PADS, , Disp: , Rfl:  .  alendronate (FOSAMAX) 70 MG tablet, , Disp: , Rfl:  .  ALPRAZolam (XANAX) 1 MG tablet, Take by mouth., Disp: , Rfl:  .  amLODipine (NORVASC) 10 MG tablet, , Disp: , Rfl:  .  aspirin 81 MG chewable tablet, Chew by mouth., Disp: , Rfl:  .  atorvastatin (LIPITOR) 10 MG tablet, Take by mouth., Disp: , Rfl:  .  cholecalciferol (VITAMIN D) 25 MCG (1000 UT) tablet, Take by mouth., Disp: , Rfl:  .  donepezil (ARICEPT) 10 MG tablet, Take by mouth., Disp: , Rfl:  .  gabapentin (NEURONTIN) 300 MG capsule, Take by mouth., Disp: , Rfl:  .  hydrALAZINE (APRESOLINE) 25 MG tablet, TAKE 1 TABLET BY MOUTH TWICE DAILY, Disp: , Rfl:  .  insulin glargine (LANTUS) 100 UNIT/ML injection, Inject 30 Units into the skin at bedtime. , Disp: , Rfl:  .  Insulin Syringe-Needle U-100 (INSULIN SYRINGE .5CC/30GX5/16") 30G X 5/16" 0.5 ML MISC, USE FOR LANTUS INJECTIONS QHS, Disp: , Rfl: 3 .  iron polysaccharides (NU-IRON) 150 MG capsule, Take by mouth., Disp: , Rfl:  .  latanoprost (XALATAN) 0.005 % ophthalmic solution, , Disp: , Rfl:  .  linagliptin (TRADJENTA) 5 MG TABS tablet, Take 5 mg by mouth daily., Disp: , Rfl:  .  losartan (COZAAR) 25 MG tablet, Take 12.5 mg by mouth daily., Disp: , Rfl:  .  metFORMIN (GLUCOPHAGE) 500 MG tablet, Take by mouth., Disp: , Rfl:  .   timolol (TIMOPTIC) 0.25 % ophthalmic solution, PLACE 1 DROP INTO BOTH EYES BID, Disp: , Rfl:   No Known Allergies  Objective: Vitals:   11/01/18 1458  Temp: (!) 97.5 F (36.4 C)    Vascular Examination: Capillary refill time immediate x10 digits.  Dorsalis pedis and posterior tibial pulses noted to be palpable bilaterally.  No digital hair x 10 digits.  Skin temperature within normal limits bilaterally.  Dermatological Examination: Skin with normal turgor, texture and tone bilaterally.  Toenails 1-5 b/l discolored, thick, dystrophic with subungual debris and pain with palpation to nailbeds due to thickness of nails.  Musculoskeletal: Muscle strength 5/5 to all LE muscle groups  Neurological: Sensation with 10 g monofilament is noted to be intact bilaterally.  Assessment: 1. Painful onychomycosis toenails 1-5 b/l 2. NIDDM with subjective symptoms of neuropathy  Plan: 1. Continue daily foot inspection.   2. Toenails 1-5 b/l were debrided in length and girth without iatrogenic bleeding. 3. Patient to continue soft, supportive shoe gear daily. 4. Patient to report any pedal injuries to medical professional immediately. 5. Follow up 3 months.  6. Patient/POA to call should there be a concern in the interim.

## 2018-11-07 ENCOUNTER — Other Ambulatory Visit: Payer: Self-pay | Admitting: Pharmacist

## 2018-11-07 ENCOUNTER — Other Ambulatory Visit: Payer: Self-pay | Admitting: Pharmacy Technician

## 2018-11-07 NOTE — Patient Outreach (Signed)
[facility]) Care Management  11/07/2018  ELICA ZOLL 10-28-1932 [id number]   Received provider portion(s) of patient assistance application for Tradjenta. Faxed completed application and required documents into [employer].  Will follow up with company in 7-10 business days to check status of application.  [name] Effie Shy CPhT Certified Pharmacy Technician [facility] Care Management Direct [phone]

## 2018-11-07 NOTE — Patient Outreach (Signed)
[facility]) Care Management [facility]  11/07/2018  Annette Fitzgerald 1932/11/02 [id number]  Successful care coordination call to Dr. Letitia Neri office.  Spoke with Selena Batten and requested pen needles for patient's new insulin pens.    Successful call to Ms. Heal's residence with HIPAA identifiers verified.  Family is so excited about new insulin pens Psychologist, sport and exercise).  Instructed daughter how to use new insulin pens and reminded her of dose= 40 units daily.   Still awaiting PCP's portion of Trajenta application.  Patient & family appreciate assistance.  PLAN: -I will follow up with patient next week  Kieth Brightly, PharmD, [facility] Clinical Pharmacist [facility]  [phone]

## 2018-11-16 ENCOUNTER — Other Ambulatory Visit: Payer: Self-pay | Admitting: Pharmacy Technician

## 2018-11-16 NOTE — Patient Outreach (Signed)
[place] [facility]) Care Management  11/16/2018  Annette Fitzgerald 11/25/1932 [id number]    Follow up call placed to [place] regarding patient assistance application(s) for [place] states that patient has been temporarily approved as of 6/6. Patient will need to submit LIS denial letter. 3 month supply of medication has been processed to be shipped out to patients home.  Follow up:  Will route note to [facility] Lottie Dawson to inform due to [place] Free working closely with patients family to assist with needs.  Maud Deed Chana Bode [place] Certified Pharmacy Technician [place] Management Direct Dial:[phone]

## 2018-11-20 DIAGNOSIS — E1142 Type 2 diabetes mellitus with diabetic polyneuropathy: Secondary | ICD-10-CM | POA: Diagnosis not present

## 2018-11-20 DIAGNOSIS — N182 Chronic kidney disease, stage 2 (mild): Secondary | ICD-10-CM | POA: Diagnosis not present

## 2018-11-21 ENCOUNTER — Ambulatory Visit: Payer: Self-pay | Admitting: Pharmacist

## 2018-11-23 ENCOUNTER — Ambulatory Visit: Payer: Self-pay | Admitting: Pharmacist

## 2018-11-24 ENCOUNTER — Other Ambulatory Visit: Payer: Self-pay | Admitting: Pharmacist

## 2018-11-24 ENCOUNTER — Ambulatory Visit: Payer: Self-pay | Admitting: Pharmacist

## 2018-11-24 NOTE — Patient Outreach (Signed)
[place] [facility]) Care Management  [place]  11/24/2018  ELFREDA BLANCHET [birth year] [id number]   Reason for referral: Medication Assistance, Medication Review, Medication Management  Referral source: [facility] RN Current insurance: [place]  Reason for call: returning patient's call (med assist letter)  Outreach:  Unsuccessful telephone call attempt to patient.   HIPAA compliant voicemail left requesting a return call  Plan:  -I will make another outreach attempt to patient within 3-4 business days.    Regina Eck, PharmD, [place]  [phone]

## 2018-11-25 ENCOUNTER — Other Ambulatory Visit: Payer: Self-pay | Admitting: Pharmacist

## 2018-11-28 ENCOUNTER — Ambulatory Visit: Payer: Self-pay | Admitting: Pharmacist

## 2018-11-28 NOTE — Patient Outreach (Addendum)
[place] [facility]) Care Management  [place]   11/25/2018  Annette Fitzgerald 1933/02/16 [id number]   Incoming call from patient stating she received letter from DSS about extra help LIS.  Requested patient fax papers to [place] in order for [place] to continue her application with [place]).  Fax information provided.  Patient received 3 month supply of Tradjenta last week.  Her blood sugars have decreased from 400s-->200s, since the re-introduction of Tradjenta and [employer].  PLAN: -I will follow up once papers are received   Regina Eck, PharmD, [place]  [phone]

## 2018-11-30 ENCOUNTER — Other Ambulatory Visit: Payer: Self-pay | Admitting: Pharmacist

## 2018-11-30 NOTE — Patient Outreach (Addendum)
[place] [facility]) Care Management  [place]  11/30/2018  ARDYTHE KLUTE 26-Jul-1932 [id number]   Reason for referral: Medication Assistance, Medication Management   Outreach:  Successful care coordination call to patient's PCP, Dr. Legrand Rams.  Requested RX for Tradjenta 5mg  #90 be sent in to patient's pharmacy ([facility] [address] [place]) to determine if patient has LIS/extra help.   Successful call to [facility] Nicki Reaper) states that Tradjenta 5mg  #90 is ready for a $8.95 copay.  This confirms that patient has received FULL LIS/extra help.    Plan:  -I will follow up with patient next week to inform her of extra help findings -I will relay information to [place] as [place] will need to be informed of new LIS  Regina Eck, PharmD, [place]  [phone]

## 2018-12-06 ENCOUNTER — Ambulatory Visit: Payer: Self-pay | Admitting: Pharmacist

## 2018-12-12 ENCOUNTER — Other Ambulatory Visit: Payer: Self-pay | Admitting: Pharmacist

## 2018-12-12 NOTE — Patient Outreach (Signed)
[place] [facility]) Care Management [place]  12/12/2018  KEMIAH BOOZ Jul 13, 1932 [id number]  Reason for referral: medication assistance/management  Successful call to Ms. Satcher's daughter [name]) with HIPAA identifiers verified x2.  Daughter states that they were able to pick up [place] from [place] for $8.90 for 90-supply.  Explained that patient now had LIS/extra help with prescription drug costs and plan. Daughter reports blood sugar has come down to 90-100s range in the morning.  She denies hypoglycemia (BG<70).  Voicemail was left with PCP office to confirm current medication regimen. Patient may not need glipizide ER.  It is not currently on patient's medication list, however it appears to have been filled for 90 day supply on 09/15/18.  Patient also appears to be due for refills on medications for chronic conditions (BP meds, statin, etc).  They are grateful of [place] efforts.  PLAN: - I will follow up with PCP office to confirm current medication list and follow up with patient & daughter next week.   Regina Eck, PharmD, [place]  [phone]

## 2018-12-13 ENCOUNTER — Ambulatory Visit: Payer: Self-pay | Admitting: Pharmacist

## 2018-12-20 DIAGNOSIS — N182 Chronic kidney disease, stage 2 (mild): Secondary | ICD-10-CM | POA: Diagnosis not present

## 2018-12-20 DIAGNOSIS — I10 Essential (primary) hypertension: Secondary | ICD-10-CM | POA: Diagnosis not present

## 2018-12-28 ENCOUNTER — Other Ambulatory Visit: Payer: Self-pay | Admitting: Pharmacist

## 2018-12-30 NOTE — Patient Outreach (Signed)
[place] [facility]) Care Management [place]  12/28/2018  Annette Fitzgerald August 03, 1932 [id number]  Reason for referral: medication assistance/management  Successful call to Annette Fitzgerald and Annette Fitzgerald's daughter Annette Fitzgerald) with HIPAA identifiers verified x2.  Patient states that she is doing well today.  Her blood sugars continue to be FBG 90-110s be report.  Daughter states that patient continues on Basaglar 40 units nightly, Tradjenta daily and glipizide 10mg  ER for diabetes.  Medications were reviewed with daughter.  Voicemail left with Dr. Legrand Rams regarding potential discontinuation of glipizide ER 10mg  if patient's BGs continue to decrease.   She denies hypoglycemia (BG<70).  Counseled on signs and symptoms of hypoglycemia and how to act on low blood sugars.    Explained that patient now had LIS/extra help with prescription drug costs and plan.  Patient and daughter continue to appreciate [place] efforts.   PLAN: - I will follow up with patient next month   Regina Eck, PharmD, [place]  [phone]

## 2019-01-09 ENCOUNTER — Ambulatory Visit: Payer: Self-pay | Admitting: Pharmacist

## 2019-01-19 ENCOUNTER — Ambulatory Visit: Payer: Self-pay | Admitting: Pharmacist

## 2019-01-20 DIAGNOSIS — I10 Essential (primary) hypertension: Secondary | ICD-10-CM | POA: Diagnosis not present

## 2019-01-20 DIAGNOSIS — N182 Chronic kidney disease, stage 2 (mild): Secondary | ICD-10-CM | POA: Diagnosis not present

## 2019-01-26 ENCOUNTER — Ambulatory Visit: Payer: Self-pay | Admitting: Pharmacist

## 2019-02-07 ENCOUNTER — Ambulatory Visit: Payer: Medicare HMO | Admitting: Podiatry

## 2019-02-07 ENCOUNTER — Other Ambulatory Visit: Payer: Self-pay | Admitting: Pharmacist

## 2019-02-07 ENCOUNTER — Encounter: Payer: Self-pay | Admitting: Podiatry

## 2019-02-07 ENCOUNTER — Ambulatory Visit: Payer: Self-pay | Admitting: Pharmacist

## 2019-02-07 ENCOUNTER — Other Ambulatory Visit: Payer: Self-pay

## 2019-02-07 DIAGNOSIS — M79675 Pain in left toe(s): Secondary | ICD-10-CM | POA: Diagnosis not present

## 2019-02-07 DIAGNOSIS — B351 Tinea unguium: Secondary | ICD-10-CM | POA: Diagnosis not present

## 2019-02-07 DIAGNOSIS — M79674 Pain in right toe(s): Secondary | ICD-10-CM

## 2019-02-07 NOTE — Patient Instructions (Signed)
Diabetes Mellitus and Foot Care Foot care is an important part of your health, especially when you have diabetes. Diabetes may cause you to have problems because of poor blood flow (circulation) to your feet and legs, which can cause your skin to:  Become thinner and drier.  Break more easily.  Heal more slowly.  Peel and crack. You may also have nerve damage (neuropathy) in your legs and feet, causing decreased feeling in them. This means that you may not notice minor injuries to your feet that could lead to more serious problems. Noticing and addressing any potential problems early is the best way to prevent future foot problems. How to care for your feet Foot hygiene  Wash your feet daily with warm water and mild soap. Do not use hot water. Then, pat your feet and the areas between your toes until they are completely dry. Do not soak your feet as this can dry your skin.  Trim your toenails straight across. Do not dig under them or around the cuticle. File the edges of your nails with an emery board or nail file.  Apply a moisturizing lotion or petroleum jelly to the skin on your feet and to dry, brittle toenails. Use lotion that does not contain alcohol and is unscented. Do not apply lotion between your toes. Shoes and socks  Wear clean socks or stockings every day. Make sure they are not too tight. Do not wear knee-high stockings since they may decrease blood flow to your legs.  Wear shoes that fit properly and have enough cushioning. Always look in your shoes before you put them on to be sure there are no objects inside.  To break in new shoes, wear them for just a few hours a day. This prevents injuries on your feet. Wounds, scrapes, corns, and calluses  Check your feet daily for blisters, cuts, bruises, sores, and redness. If you cannot see the bottom of your feet, use a mirror or ask someone for help.  Do not cut corns or calluses or try to remove them with medicine.  If you  find a minor scrape, cut, or break in the skin on your feet, keep it and the skin around it clean and dry. You may clean these areas with mild soap and water. Do not clean the area with peroxide, alcohol, or iodine.  If you have a wound, scrape, corn, or callus on your foot, look at it several times a day to make sure it is healing and not infected. Check for: ? Redness, swelling, or pain. ? Fluid or blood. ? Warmth. ? Pus or a bad smell. General instructions  Do not cross your legs. This may decrease blood flow to your feet.  Do not use heating pads or hot water bottles on your feet. They may burn your skin. If you have lost feeling in your feet or legs, you may not know this is happening until it is too late.  Protect your feet from hot and cold by wearing shoes, such as at the beach or on hot pavement.  Schedule a complete foot exam at least once a year (annually) or more often if you have foot problems. If you have foot problems, report any cuts, sores, or bruises to your health care provider immediately. Contact a health care provider if:  You have a medical condition that increases your risk of infection and you have any cuts, sores, or bruises on your feet.  You have an injury that is not   healing.  You have redness on your legs or feet.  You feel burning or tingling in your legs or feet.  You have pain or cramps in your legs and feet.  Your legs or feet are numb.  Your feet always feel cold.  You have pain around a toenail. Get help right away if:  You have a wound, scrape, corn, or callus on your foot and: ? You have pain, swelling, or redness that gets worse. ? You have fluid or blood coming from the wound, scrape, corn, or callus. ? Your wound, scrape, corn, or callus feels warm to the touch. ? You have pus or a bad smell coming from the wound, scrape, corn, or callus. ? You have a fever. ? You have a red line going up your leg. Summary  Check your feet every day  for cuts, sores, red spots, swelling, and blisters.  Moisturize feet and legs daily.  Wear shoes that fit properly and have enough cushioning.  If you have foot problems, report any cuts, sores, or bruises to your health care provider immediately.  Schedule a complete foot exam at least once a year (annually) or more often if you have foot problems. This information is not intended to replace advice given to you by your health care provider. Make sure you discuss any questions you have with your health care provider. Document Released: 05/22/2000 Document Revised: 07/07/2017 Document Reviewed: 06/26/2016 Elsevier Patient Education  2020 Elsevier Inc.   Onychomycosis/Fungal Toenails  WHAT IS IT? An infection that lies within the keratin of your nail plate that is caused by a fungus.  WHY ME? Fungal infections affect all ages, sexes, races, and creeds.  There may be many factors that predispose you to a fungal infection such as age, coexisting medical conditions such as diabetes, or an autoimmune disease; stress, medications, fatigue, genetics, etc.  Bottom line: fungus thrives in a warm, moist environment and your shoes offer such a location.  IS IT CONTAGIOUS? Theoretically, yes.  You do not want to share shoes, nail clippers or files with someone who has fungal toenails.  Walking around barefoot in the same room or sleeping in the same bed is unlikely to transfer the organism.  It is important to realize, however, that fungus can spread easily from one nail to the next on the same foot.  HOW DO WE TREAT THIS?  There are several ways to treat this condition.  Treatment may depend on many factors such as age, medications, pregnancy, liver and kidney conditions, etc.  It is best to ask your doctor which options are available to you.  1. No treatment.   Unlike many other medical concerns, you can live with this condition.  However for many people this can be a painful condition and may lead to  ingrown toenails or a bacterial infection.  It is recommended that you keep the nails cut short to help reduce the amount of fungal nail. 2. Topical treatment.  These range from herbal remedies to prescription strength nail lacquers.  About 40-50% effective, topicals require twice daily application for approximately 9 to 12 months or until an entirely new nail has grown out.  The most effective topicals are medical grade medications available through physicians offices. 3. Oral antifungal medications.  With an 80-90% cure rate, the most common oral medication requires 3 to 4 months of therapy and stays in your system for a year as the new nail grows out.  Oral antifungal medications do require   blood work to make sure it is a safe drug for you.  A liver function panel will be performed prior to starting the medication and after the first month of treatment.  It is important to have the blood work performed to avoid any harmful side effects.  In general, this medication safe but blood work is required. 4. Laser Therapy.  This treatment is performed by applying a specialized laser to the affected nail plate.  This therapy is noninvasive, fast, and non-painful.  It is not covered by insurance and is therefore, out of pocket.  The results have been very good with a 80-95% cure rate.  The [facility] is the only practice in the area to offer this therapy. 5. Permanent Nail Avulsion.  Removing the entire nail so that a new nail will not grow back. 

## 2019-02-07 NOTE — Patient Outreach (Signed)
[place] [facility] Care Management [place]  02/07/2019  Annette Fitzgerald 09-07-1932 [id number]  Reason for referral:medication assistance/management  Successful call to Ms. Annette Fitzgerald and Ms. Annette Fitzgerald) with HIPAA identifiers verified x2. Patient states that she is doing well today.  Her blood sugars continue to be FBG 90-100s per report.  Daughter states that patient continues on Basaglar 40 units nightly, Tradjenta daily and glipizide 10mg  ER for diabetes.  Medications were reviewed with daughter and updated in the EMR..  Voicemail left with Dr. Legrand Rams on 12/28/18 regarding potential discontinuation of glipizide ER 10mg  if patient's BGs continue to decrease.  He wishes to continue glipizide ER at this time.  Will continue to follow. She denies hypoglycemia (BG<70). Counseled on signs and symptoms of hypoglycemia and how to act on low blood sugars.  Patient and daughter continue to appreciate [place] efforts.  Patient also in need of [employer] program refills.  Gave daughter the number to call in refills and discusses process.  Encouraged patient and daughter to call if needs arise.   PLAN: - I will follow up with patient in 2 months.  Annette Fitzgerald, PharmD, [place]  [phone]

## 2019-02-14 NOTE — Progress Notes (Signed)
Subjective: Annette Fitzgerald is seen today for follow up painful, elongated, thickened toenails 1-5 b/l feet that she cannot cut. Pain interferes with daily activities. Aggravating factor includes wearing enclosed shoe gear and relieved with periodic debridement.  Objective:  Neurovascular status unchanged: Vascular Examination: Capillary refill time immediate x 10 digits.  Dorsalis pedis present b/l.  Posterior tibial pulses present b/l.  Digital hair absent b/l.  Skin temperature gradient WNL b/l.  Sensation intact 5/5 b/l with 10 gram monofilament.  Dermatological Examination: Skin with normal turgor, texture and tone b/l  Toenails 1-5 b/l discolored, thick, dystrophic with subungual debris and pain with palpation to nailbeds due to thickness of nails.  Musculoskeletal: Muscle strength 5/5 to all LE muscle groups.  No gross bony deformities b/l.  No pain, crepitus or joint limitation noted with ROM.   Assessment: Painful onychomycosis toenails 1-5 b/l   Plan: 1. Toenails 1-5 b/l were debrided in length and girth without iatrogenic bleeding. 2. Patient to continue soft, supportive shoe gear 3. Patient to report any pedal injuries to medical professional immediately. 4. Follow up 3 months.  5. Patient/POA to call should there be a concern in the interim.

## 2019-03-01 DIAGNOSIS — Z23 Encounter for immunization: Secondary | ICD-10-CM | POA: Diagnosis not present

## 2019-03-22 DIAGNOSIS — I10 Essential (primary) hypertension: Secondary | ICD-10-CM | POA: Diagnosis not present

## 2019-03-22 DIAGNOSIS — E1142 Type 2 diabetes mellitus with diabetic polyneuropathy: Secondary | ICD-10-CM | POA: Diagnosis not present

## 2019-04-10 ENCOUNTER — Ambulatory Visit: Payer: Self-pay | Admitting: Pharmacist

## 2019-04-14 ENCOUNTER — Other Ambulatory Visit: Payer: Self-pay | Admitting: Pharmacist

## 2019-04-14 NOTE — Patient Outreach (Signed)
[place] [facility]) Care Management [place]  04/14/2019  NEIDRA GIRVAN August 08, 1932 [id number]  Reason for referral: medication management/assistance  [facility] pharmacy case is being closed due to the following reasons:  -Will close [facility] pharmacy case as goals of care have been met.    Regina Eck, PharmD, [place]  [phone]

## 2019-04-18 ENCOUNTER — Ambulatory Visit: Payer: Self-pay | Admitting: Pharmacist

## 2019-04-20 DIAGNOSIS — E1142 Type 2 diabetes mellitus with diabetic polyneuropathy: Secondary | ICD-10-CM | POA: Diagnosis not present

## 2019-04-20 DIAGNOSIS — Z0001 Encounter for general adult medical examination with abnormal findings: Secondary | ICD-10-CM | POA: Diagnosis not present

## 2019-04-20 DIAGNOSIS — Z1389 Encounter for screening for other disorder: Secondary | ICD-10-CM | POA: Diagnosis not present

## 2019-04-20 DIAGNOSIS — Z1331 Encounter for screening for depression: Secondary | ICD-10-CM | POA: Diagnosis not present

## 2019-04-20 DIAGNOSIS — I10 Essential (primary) hypertension: Secondary | ICD-10-CM | POA: Diagnosis not present

## 2019-04-20 DIAGNOSIS — G301 Alzheimer's disease with late onset: Secondary | ICD-10-CM | POA: Diagnosis not present

## 2019-04-20 DIAGNOSIS — E7849 Other hyperlipidemia: Secondary | ICD-10-CM | POA: Diagnosis not present

## 2019-04-20 DIAGNOSIS — N182 Chronic kidney disease, stage 2 (mild): Secondary | ICD-10-CM | POA: Diagnosis not present

## 2019-05-02 ENCOUNTER — Other Ambulatory Visit: Payer: Self-pay | Admitting: Pharmacist

## 2019-05-08 NOTE — Patient Outreach (Signed)
[place] [facility]) Care Management  [place]  05/02/2019  RICKY DOAN [birth year] [id number]   Reason for referral:medication assistance/management  Successful call to Ms. Bryantand Ms. Wisby's daughter [name]) with HIPAA identifiers verified x2.Patient states that she is doing well today. Her blood sugars continue to be FBG 90-100s per report. Daughter states thatpatient continues on Basaglar 40 units nightly, Tradjenta daily and glipizide 10mg  ER for diabetes. PCP wishes to continue glipizide, but could potentially d/c in the future if BGs remain within normal limits.  Voicemail left with Dr. Legrand Rams today to call in [place] 3 month supply to pharmacy so patient will be able to get for $8.95.  Will be unable to do patient assistance due to FULL LIS. She denies hypoglycemia (BG<70).Counseled on signs and symptoms of hypoglycemia and how to act on low blood sugars.Patient and daughter continue to [place] efforts.  Plan:  -Will hand off to [place] Pharmacist for assistance for 2021  Regina Eck, PharmD, BCPS Clinical Pharmacist, [facility] [place]: [phone]

## 2019-05-16 ENCOUNTER — Encounter: Payer: Self-pay | Admitting: Podiatry

## 2019-05-16 ENCOUNTER — Ambulatory Visit: Payer: Medicare HMO | Admitting: Podiatry

## 2019-05-16 ENCOUNTER — Other Ambulatory Visit: Payer: Self-pay

## 2019-05-16 DIAGNOSIS — M79674 Pain in right toe(s): Secondary | ICD-10-CM

## 2019-05-16 DIAGNOSIS — B351 Tinea unguium: Secondary | ICD-10-CM | POA: Diagnosis not present

## 2019-05-16 DIAGNOSIS — M79675 Pain in left toe(s): Secondary | ICD-10-CM | POA: Diagnosis not present

## 2019-05-16 NOTE — Patient Instructions (Signed)
Diabetes Mellitus and Foot Care Foot care is an important part of your health, especially when you have diabetes. Diabetes may cause you to have problems because of poor blood flow (circulation) to your feet and legs, which can cause your skin to:  Become thinner and drier.  Break more easily.  Heal more slowly.  Peel and crack. You may also have nerve damage (neuropathy) in your legs and feet, causing decreased feeling in them. This means that you may not notice minor injuries to your feet that could lead to more serious problems. Noticing and addressing any potential problems early is the best way to prevent future foot problems. How to care for your feet Foot hygiene  Wash your feet daily with warm water and mild soap. Do not use hot water. Then, pat your feet and the areas between your toes until they are completely dry. Do not soak your feet as this can dry your skin.  Trim your toenails straight across. Do not dig under them or around the cuticle. File the edges of your nails with an emery board or nail file.  Apply a moisturizing lotion or petroleum jelly to the skin on your feet and to dry, brittle toenails. Use lotion that does not contain alcohol and is unscented. Do not apply lotion between your toes. Shoes and socks  Wear clean socks or stockings every day. Make sure they are not too tight. Do not wear knee-high stockings since they may decrease blood flow to your legs.  Wear shoes that fit properly and have enough cushioning. Always look in your shoes before you put them on to be sure there are no objects inside.  To break in new shoes, wear them for just a few hours a day. This prevents injuries on your feet. Wounds, scrapes, corns, and calluses  Check your feet daily for blisters, cuts, bruises, sores, and redness. If you cannot see the bottom of your feet, use a mirror or ask someone for help.  Do not cut corns or calluses or try to remove them with medicine.  If you  find a minor scrape, cut, or break in the skin on your feet, keep it and the skin around it clean and dry. You may clean these areas with mild soap and water. Do not clean the area with peroxide, alcohol, or iodine.  If you have a wound, scrape, corn, or callus on your foot, look at it several times a day to make sure it is healing and not infected. Check for: ? Redness, swelling, or pain. ? Fluid or blood. ? Warmth. ? Pus or a bad smell. General instructions  Do not cross your legs. This may decrease blood flow to your feet.  Do not use heating pads or hot water bottles on your feet. They may burn your skin. If you have lost feeling in your feet or legs, you may not know this is happening until it is too late.  Protect your feet from hot and cold by wearing shoes, such as at the beach or on hot pavement.  Schedule a complete foot exam at least once a year (annually) or more often if you have foot problems. If you have foot problems, report any cuts, sores, or bruises to your health care provider immediately. Contact a health care provider if:  You have a medical condition that increases your risk of infection and you have any cuts, sores, or bruises on your feet.  You have an injury that is not   healing.  You have redness on your legs or feet.  You feel burning or tingling in your legs or feet.  You have pain or cramps in your legs and feet.  Your legs or feet are numb.  Your feet always feel cold.  You have pain around a toenail. Get help right away if:  You have a wound, scrape, corn, or callus on your foot and: ? You have pain, swelling, or redness that gets worse. ? You have fluid or blood coming from the wound, scrape, corn, or callus. ? Your wound, scrape, corn, or callus feels warm to the touch. ? You have pus or a bad smell coming from the wound, scrape, corn, or callus. ? You have a fever. ? You have a red line going up your leg. Summary  Check your feet every day  for cuts, sores, red spots, swelling, and blisters.  Moisturize feet and legs daily.  Wear shoes that fit properly and have enough cushioning.  If you have foot problems, report any cuts, sores, or bruises to your health care provider immediately.  Schedule a complete foot exam at least once a year (annually) or more often if you have foot problems. This information is not intended to replace advice given to you by your health care provider. Make sure you discuss any questions you have with your health care provider. Document Released: 05/22/2000 Document Revised: 07/07/2017 Document Reviewed: 06/26/2016 Elsevier Patient Education  2020 [employer]  

## 2019-05-18 DIAGNOSIS — E1142 Type 2 diabetes mellitus with diabetic polyneuropathy: Secondary | ICD-10-CM | POA: Diagnosis not present

## 2019-05-18 DIAGNOSIS — N182 Chronic kidney disease, stage 2 (mild): Secondary | ICD-10-CM | POA: Diagnosis not present

## 2019-05-21 NOTE — Progress Notes (Signed)
Subjective:  DUNIA PRINGLE presents to clinic today with cc of  painful, thick, discolored, elongated toenails of both feet that become tender and patient cannot cut because of thickness. Pain is aggravated when wearing enclosed shoe gear and relieved with periodic professional debridement.  Patient voices no new pedal concerns on today's visit.  Medications reviewed in chart.  No Known Allergies   Objective:  Physical Examination:  Vascular Examination: Capillary refill time immediate b/l.  Palpable DP/PT pulses b/l.  Digital hair absent b/l.   No edema noted b/l.  Skin temperature gradient WNL b/l.  Dermatological Examination: Skin with normal turgor, texture and tone b/l.  No open wounds b/l.  No interdigital macerations noted b/l.  Elongated, thick, discolored brittle toenails with subungual debris and pain on dorsal palpation of nailbeds 1-5 b/l.  Musculoskeletal Examination: Muscle strength 5/5 to all muscle groups b/l.  No pain, crepitus or joint discomfort with active/passive ROM.  Neurological Examination: Sensation intact 5/5 b/l with 10 gram monofilament.  Assessment: Mycotic nail infection with pain 1-5 b/l  Plan: 1. Toenails 1-5 b/l were debrided in length and girth without iatrogenic laceration. 2.  Continue soft, supportive shoe gear daily. 3.  Report any pedal injuries to medical professional. 4.  Follow up 3 months. 5.  Patient/POA to call should there be a question/concern in there interim.

## 2019-06-18 DIAGNOSIS — E1142 Type 2 diabetes mellitus with diabetic polyneuropathy: Secondary | ICD-10-CM | POA: Diagnosis not present

## 2019-06-18 DIAGNOSIS — G301 Alzheimer's disease with late onset: Secondary | ICD-10-CM | POA: Diagnosis not present

## 2019-07-19 DIAGNOSIS — E1142 Type 2 diabetes mellitus with diabetic polyneuropathy: Secondary | ICD-10-CM | POA: Diagnosis not present

## 2019-07-19 DIAGNOSIS — N182 Chronic kidney disease, stage 2 (mild): Secondary | ICD-10-CM | POA: Diagnosis not present

## 2019-08-15 ENCOUNTER — Encounter: Payer: Self-pay | Admitting: Podiatry

## 2019-08-15 ENCOUNTER — Other Ambulatory Visit: Payer: Self-pay

## 2019-08-15 ENCOUNTER — Ambulatory Visit (INDEPENDENT_AMBULATORY_CARE_PROVIDER_SITE_OTHER): Payer: Medicare HMO | Admitting: Podiatry

## 2019-08-15 VITALS — Temp 96.8°F

## 2019-08-15 DIAGNOSIS — M79674 Pain in right toe(s): Secondary | ICD-10-CM

## 2019-08-15 DIAGNOSIS — E1142 Type 2 diabetes mellitus with diabetic polyneuropathy: Secondary | ICD-10-CM

## 2019-08-15 DIAGNOSIS — B351 Tinea unguium: Secondary | ICD-10-CM

## 2019-08-15 DIAGNOSIS — M79675 Pain in left toe(s): Secondary | ICD-10-CM

## 2019-08-15 NOTE — Patient Instructions (Signed)
Diabetes Mellitus and Foot Care Foot care is an important part of your health, especially when you have diabetes. Diabetes may cause you to have problems because of poor blood flow (circulation) to your feet and legs, which can cause your skin to:  Become thinner and drier.  Break more easily.  Heal more slowly.  Peel and crack. You may also have nerve damage (neuropathy) in your legs and feet, causing decreased feeling in them. This means that you may not notice minor injuries to your feet that could lead to more serious problems. Noticing and addressing any potential problems early is the best way to prevent future foot problems. How to care for your feet Foot hygiene  Wash your feet daily with warm water and mild soap. Do not use hot water. Then, pat your feet and the areas between your toes until they are completely dry. Do not soak your feet as this can dry your skin.  Trim your toenails straight across. Do not dig under them or around the cuticle. File the edges of your nails with an emery board or nail file.  Apply a moisturizing lotion or petroleum jelly to the skin on your feet and to dry, brittle toenails. Use lotion that does not contain alcohol and is unscented. Do not apply lotion between your toes. Shoes and socks  Wear clean socks or stockings every day. Make sure they are not too tight. Do not wear knee-high stockings since they may decrease blood flow to your legs.  Wear shoes that fit properly and have enough cushioning. Always look in your shoes before you put them on to be sure there are no objects inside.  To break in new shoes, wear them for just a few hours a day. This prevents injuries on your feet. Wounds, scrapes, corns, and calluses  Check your feet daily for blisters, cuts, bruises, sores, and redness. If you cannot see the bottom of your feet, use a mirror or ask someone for help.  Do not cut corns or calluses or try to remove them with medicine.  If you  find a minor scrape, cut, or break in the skin on your feet, keep it and the skin around it clean and dry. You may clean these areas with mild soap and water. Do not clean the area with peroxide, alcohol, or iodine.  If you have a wound, scrape, corn, or callus on your foot, look at it several times a day to make sure it is healing and not infected. Check for: ? Redness, swelling, or pain. ? Fluid or blood. ? Warmth. ? Pus or a bad smell. General instructions  Do not cross your legs. This may decrease blood flow to your feet.  Do not use heating pads or hot water bottles on your feet. They may burn your skin. If you have lost feeling in your feet or legs, you may not know this is happening until it is too late.  Protect your feet from hot and cold by wearing shoes, such as at the beach or on hot pavement.  Schedule a complete foot exam at least once a year (annually) or more often if you have foot problems. If you have foot problems, report any cuts, sores, or bruises to your health care provider immediately. Contact a health care provider if:  You have a medical condition that increases your risk of infection and you have any cuts, sores, or bruises on your feet.  You have an injury that is not   healing.  You have redness on your legs or feet.  You feel burning or tingling in your legs or feet.  You have pain or cramps in your legs and feet.  Your legs or feet are numb.  Your feet always feel cold.  You have pain around a toenail. Get help right away if:  You have a wound, scrape, corn, or callus on your foot and: ? You have pain, swelling, or redness that gets worse. ? You have fluid or blood coming from the wound, scrape, corn, or callus. ? Your wound, scrape, corn, or callus feels warm to the touch. ? You have pus or a bad smell coming from the wound, scrape, corn, or callus. ? You have a fever. ? You have a red line going up your leg. Summary  Check your feet every day  for cuts, sores, red spots, swelling, and blisters.  Moisturize feet and legs daily.  Wear shoes that fit properly and have enough cushioning.  If you have foot problems, report any cuts, sores, or bruises to your health care provider immediately.  Schedule a complete foot exam at least once a year (annually) or more often if you have foot problems. This information is not intended to replace advice given to you by your health care provider. Make sure you discuss any questions you have with your health care provider. Document Revised: 02/15/2019 Document Reviewed: 06/26/2016 Elsevier Patient Education  2020 [employer] Inc[employer]  

## 2019-08-16 DIAGNOSIS — I10 Essential (primary) hypertension: Secondary | ICD-10-CM | POA: Diagnosis not present

## 2019-08-16 DIAGNOSIS — E1142 Type 2 diabetes mellitus with diabetic polyneuropathy: Secondary | ICD-10-CM | POA: Diagnosis not present

## 2019-08-21 NOTE — Progress Notes (Signed)
Subjective: Annette Fitzgerald presents today for follow up of preventative diabetic foot care and painful mycotic nails b/l that are difficult to trim. Pain interferes with ambulation. Aggravating factors include wearing enclosed shoe gear. Pain is relieved with periodic professional debridement.   No Known Allergies   Objective: Vitals:   08/15/19 1529  Temp: (!) 96.8 F (36 C)    Pt 86 y.o. year old female  in NAD. AAO x 3.   Vascular Examination:  Capillary refill time to digits immediate b/l. Palpable DP pulses b/l. Palpable PT pulses b/l. Pedal hair absent b/l Skin temperature gradient within normal limits b/l.  Dermatological Examination: Pedal skin with normal turgor, texture and tone bilaterally. No open wounds bilaterally. Toenails 1-5 b/l elongated, dystrophic, thickened, crumbly with subungual debris and tenderness to dorsal palpation.  Musculoskeletal: Normal muscle strength 5/5 to all lower extremity muscle groups bilaterally, no gross bony deformities bilaterally and no pain crepitus or joint limitation noted with ROM b/l  Neurological: Protective sensation intact 5/5 intact bilaterally with 10g monofilament b/l  Assessment: 1. Pain due to onychomycosis of toenails of both feet   2. Diabetic peripheral neuropathy associated with type 2 diabetes mellitus (HCC)    Plan: -Continue diabetic foot care principles. Literature dispensed on today.  -Toenails 1-5 b/l were debrided in length and girth with sterile nail nippers and dremel without iatrogenic bleeding.  -Patient to continue soft, supportive shoe gear daily. -Patient to report any pedal injuries to medical professional immediately. -Patient/POA to call should there be question/concern in the interim.  Return in about 3 months (around 11/15/2019) for diabetic nail trim.

## 2019-09-16 DIAGNOSIS — I10 Essential (primary) hypertension: Secondary | ICD-10-CM | POA: Diagnosis not present

## 2019-09-16 DIAGNOSIS — E1142 Type 2 diabetes mellitus with diabetic polyneuropathy: Secondary | ICD-10-CM | POA: Diagnosis not present

## 2019-10-13 DIAGNOSIS — D631 Anemia in chronic kidney disease: Secondary | ICD-10-CM | POA: Diagnosis not present

## 2019-10-13 DIAGNOSIS — N1831 Chronic kidney disease, stage 3a: Secondary | ICD-10-CM | POA: Diagnosis not present

## 2019-10-13 DIAGNOSIS — IMO0002 Reserved for concepts with insufficient information to code with codable children: Secondary | ICD-10-CM | POA: Insufficient documentation

## 2019-10-13 DIAGNOSIS — Z79899 Other long term (current) drug therapy: Secondary | ICD-10-CM | POA: Diagnosis not present

## 2019-10-13 DIAGNOSIS — E559 Vitamin D deficiency, unspecified: Secondary | ICD-10-CM | POA: Diagnosis not present

## 2019-10-13 DIAGNOSIS — I10 Essential (primary) hypertension: Secondary | ICD-10-CM | POA: Insufficient documentation

## 2019-10-13 DIAGNOSIS — R809 Proteinuria, unspecified: Secondary | ICD-10-CM | POA: Diagnosis not present

## 2019-10-20 DIAGNOSIS — N1832 Chronic kidney disease, stage 3b: Secondary | ICD-10-CM | POA: Diagnosis not present

## 2019-10-20 DIAGNOSIS — Z79899 Other long term (current) drug therapy: Secondary | ICD-10-CM | POA: Diagnosis not present

## 2019-10-20 DIAGNOSIS — N189 Chronic kidney disease, unspecified: Secondary | ICD-10-CM | POA: Diagnosis not present

## 2019-10-20 DIAGNOSIS — E559 Vitamin D deficiency, unspecified: Secondary | ICD-10-CM | POA: Diagnosis not present

## 2019-10-20 DIAGNOSIS — R809 Proteinuria, unspecified: Secondary | ICD-10-CM | POA: Diagnosis not present

## 2019-10-20 DIAGNOSIS — D631 Anemia in chronic kidney disease: Secondary | ICD-10-CM | POA: Diagnosis not present

## 2019-10-20 DIAGNOSIS — E1142 Type 2 diabetes mellitus with diabetic polyneuropathy: Secondary | ICD-10-CM | POA: Diagnosis not present

## 2019-10-20 DIAGNOSIS — N182 Chronic kidney disease, stage 2 (mild): Secondary | ICD-10-CM | POA: Diagnosis not present

## 2019-10-20 DIAGNOSIS — I129 Hypertensive chronic kidney disease with stage 1 through stage 4 chronic kidney disease, or unspecified chronic kidney disease: Secondary | ICD-10-CM | POA: Diagnosis not present

## 2019-10-20 DIAGNOSIS — I10 Essential (primary) hypertension: Secondary | ICD-10-CM | POA: Diagnosis not present

## 2019-10-23 DIAGNOSIS — I10 Essential (primary) hypertension: Secondary | ICD-10-CM | POA: Diagnosis not present

## 2019-10-23 DIAGNOSIS — G301 Alzheimer's disease with late onset: Secondary | ICD-10-CM | POA: Diagnosis not present

## 2019-10-23 DIAGNOSIS — I70234 Atherosclerosis of native arteries of right leg with ulceration of heel and midfoot: Secondary | ICD-10-CM | POA: Diagnosis not present

## 2019-10-23 DIAGNOSIS — E1142 Type 2 diabetes mellitus with diabetic polyneuropathy: Secondary | ICD-10-CM | POA: Diagnosis not present

## 2019-11-08 DIAGNOSIS — H3582 Retinal ischemia: Secondary | ICD-10-CM | POA: Diagnosis not present

## 2019-11-08 DIAGNOSIS — H53411 Scotoma involving central area, right eye: Secondary | ICD-10-CM | POA: Diagnosis not present

## 2019-11-08 DIAGNOSIS — H31093 Other chorioretinal scars, bilateral: Secondary | ICD-10-CM | POA: Diagnosis not present

## 2019-11-08 DIAGNOSIS — H401132 Primary open-angle glaucoma, bilateral, moderate stage: Secondary | ICD-10-CM | POA: Diagnosis not present

## 2019-11-08 DIAGNOSIS — Z961 Presence of intraocular lens: Secondary | ICD-10-CM | POA: Diagnosis not present

## 2019-11-08 DIAGNOSIS — E113553 Type 2 diabetes mellitus with stable proliferative diabetic retinopathy, bilateral: Secondary | ICD-10-CM | POA: Diagnosis not present

## 2019-11-15 ENCOUNTER — Encounter: Payer: Self-pay | Admitting: Podiatry

## 2019-11-15 ENCOUNTER — Ambulatory Visit: Payer: Medicare HMO | Admitting: Podiatry

## 2019-11-15 ENCOUNTER — Other Ambulatory Visit: Payer: Self-pay

## 2019-11-15 DIAGNOSIS — B351 Tinea unguium: Secondary | ICD-10-CM

## 2019-11-15 DIAGNOSIS — E1142 Type 2 diabetes mellitus with diabetic polyneuropathy: Secondary | ICD-10-CM

## 2019-11-15 DIAGNOSIS — M79675 Pain in left toe(s): Secondary | ICD-10-CM | POA: Diagnosis not present

## 2019-11-15 DIAGNOSIS — M79674 Pain in right toe(s): Secondary | ICD-10-CM

## 2019-11-15 NOTE — Progress Notes (Signed)
Subjective: Annette Fitzgerald presents today at risk foot care. Pt has h/o NIDDM with chronic kidney disease and painful mycotic nails b/l that are difficult to trim. Pain interferes with ambulation. Aggravating factors include wearing enclosed shoe gear. Pain is relieved with periodic professional debridement.  Her daughter is present during today's visit. They voice no new pedal concerns on today's visit.  Rosita Fire, MD is patient's PCP. Last visit was: 09/16/2019.  Past Medical History:  Diagnosis Date  . Diabetes mellitus without complication ([place])   . Hypertension   . Neuropathy due to type 2 diabetes mellitus ([place])      Current Outpatient Medications on File Prior to Visit  Medication Sig Dispense Refill  . ACCU-CHEK AVIVA PLUS test strip     . ACCU-CHEK SOFTCLIX LANCETS lancets     . Alcohol Swabs (B-D SINGLE USE SWABS REGULAR) PADS     . alendronate (FOSAMAX) 70 MG tablet Take 70 mg by mouth once a week.     . ALPRAZolam (XANAX) 1 MG tablet Take by mouth.    Marland Kitchen amLODipine (NORVASC) 10 MG tablet     . aspirin 81 MG chewable tablet Chew by mouth.    Marland Kitchen atorvastatin (LIPITOR) 10 MG tablet Take by mouth.    . cholecalciferol (VITAMIN D) 25 MCG (1000 UT) tablet Take by mouth.    . donepezil (ARICEPT) 10 MG tablet Take by mouth.    . gabapentin (NEURONTIN) 300 MG capsule Take 300 mg by mouth 2 (two) times daily.     Marland Kitchen glipiZIDE (GLUCOTROL XL) 10 MG 24 hr tablet Take 10 mg by mouth daily with breakfast.    . hydrALAZINE (APRESOLINE) 25 MG tablet TAKE 1 TABLET BY MOUTH TWICE DAILY    . Insulin Glargine (BASAGLAR KWIKPEN) 100 UNIT/ML SOPN Inject 40 Units into the skin daily.    . iron polysaccharides (NU-IRON) 150 MG capsule Take by mouth.    . latanoprost (XALATAN) 0.005 % ophthalmic solution     . linagliptin (TRADJENTA) 5 MG TABS tablet Take 5 mg by mouth daily.    Marland Kitchen losartan (COZAAR) 25 MG tablet Take 12.5 mg by mouth daily.    . timolol (TIMOPTIC) 0.25 % ophthalmic solution  PLACE 1 DROP INTO BOTH EYES BID     No current facility-administered medications on file prior to visit.     No Known Allergies  Objective: Annette Fitzgerald is a pleasant 86 y.o. y.o. Patient Race: Black or African American [2]  female in NAD. AAO x 3.  There were no vitals filed for this visit.  Vascular Examination: Neurovascular status unchanged b/l lower extremities. Capillary refill time to digits immediate b/l. Palpable DP pulses b/l. Palpable PT pulses b/l. Pedal hair absent b/l. Skin temperature gradient within normal limits b/l. No pain with calf compression b/l. No edema noted b/l.  Dermatological Examination: Pedal skin with normal turgor, texture and tone bilaterally. No open wounds bilaterally. No interdigital macerations bilaterally. Toenails 1-5 b/l elongated, discolored, dystrophic, thickened, crumbly with subungual debris and tenderness to dorsal palpation.  Musculoskeletal: Normal muscle strength 5/5 to all lower extremity muscle groups bilaterally. No pain crepitus or joint limitation noted with ROM b/l. No gross bony deformities bilaterally.  Neurological Examination: Pt has subjective symptoms of neuropathy. Protective sensation intact 5/5 intact bilaterally with 10g monofilament b/l. Vibratory sensation intact b/l.  Assessment: 1. Pain due to onychomycosis of toenails of both feet   2. Diabetic peripheral neuropathy associated with type 2 diabetes mellitus ([place])  Plan: -Examined patient. -Toenails 1-5 b/l were debrided in length and girth with sterile nail nippers and dremel without iatrogenic bleeding. Continue diabetic foot care principles daily. -Patient to continue soft, supportive shoe gear daily. -Patient to report any pedal injuries to medical professional immediately. -Patient/POA to call should there be question/concern in the interim.  Return in about 14 weeks (around 02/21/2020) for diabetic nail trim.  Freddie Breech, DPM

## 2019-11-15 NOTE — Patient Instructions (Signed)
Diabetes Mellitus and Foot Care Foot care is an important part of your health, especially when you have diabetes. Diabetes may cause you to have problems because of poor blood flow (circulation) to your feet and legs, which can cause your skin to:  Become thinner and drier.  Break more easily.  Heal more slowly.  Peel and crack. You may also have nerve damage (neuropathy) in your legs and feet, causing decreased feeling in them. This means that you may not notice minor injuries to your feet that could lead to more serious problems. Noticing and addressing any potential problems early is the best way to prevent future foot problems. How to care for your feet Foot hygiene  Wash your feet daily with warm water and mild soap. Do not use hot water. Then, pat your feet and the areas between your toes until they are completely dry. Do not soak your feet as this can dry your skin.  Trim your toenails straight across. Do not dig under them or around the cuticle. File the edges of your nails with an emery board or nail file.  Apply a moisturizing lotion or petroleum jelly to the skin on your feet and to dry, brittle toenails. Use lotion that does not contain alcohol and is unscented. Do not apply lotion between your toes. Shoes and socks  Wear clean socks or stockings every day. Make sure they are not too tight. Do not wear knee-high stockings since they may decrease blood flow to your legs.  Wear shoes that fit properly and have enough cushioning. Always look in your shoes before you put them on to be sure there are no objects inside.  To break in new shoes, wear them for just a few hours a day. This prevents injuries on your feet. Wounds, scrapes, corns, and calluses  Check your feet daily for blisters, cuts, bruises, sores, and redness. If you cannot see the bottom of your feet, use a mirror or ask someone for help.  Do not cut corns or calluses or try to remove them with medicine.  If you  find a minor scrape, cut, or break in the skin on your feet, keep it and the skin around it clean and dry. You may clean these areas with mild soap and water. Do not clean the area with peroxide, alcohol, or iodine.  If you have a wound, scrape, corn, or callus on your foot, look at it several times a day to make sure it is healing and not infected. Check for: ? Redness, swelling, or pain. ? Fluid or blood. ? Warmth. ? Pus or a bad smell. General instructions  Do not cross your legs. This may decrease blood flow to your feet.  Do not use heating pads or hot water bottles on your feet. They may burn your skin. If you have lost feeling in your feet or legs, you may not know this is happening until it is too late.  Protect your feet from hot and cold by wearing shoes, such as at the beach or on hot pavement.  Schedule a complete foot exam at least once a year (annually) or more often if you have foot problems. If you have foot problems, report any cuts, sores, or bruises to your health care provider immediately. Contact a health care provider if:  You have a medical condition that increases your risk of infection and you have any cuts, sores, or bruises on your feet.  You have an injury that is not   healing.  You have redness on your legs or feet.  You feel burning or tingling in your legs or feet.  You have pain or cramps in your legs and feet.  Your legs or feet are numb.  Your feet always feel cold.  You have pain around a toenail. Get help right away if:  You have a wound, scrape, corn, or callus on your foot and: ? You have pain, swelling, or redness that gets worse. ? You have fluid or blood coming from the wound, scrape, corn, or callus. ? Your wound, scrape, corn, or callus feels warm to the touch. ? You have pus or a bad smell coming from the wound, scrape, corn, or callus. ? You have a fever. ? You have a red line going up your leg. Summary  Check your feet every day  for cuts, sores, red spots, swelling, and blisters.  Moisturize feet and legs daily.  Wear shoes that fit properly and have enough cushioning.  If you have foot problems, report any cuts, sores, or bruises to your health care provider immediately.  Schedule a complete foot exam at least once a year (annually) or more often if you have foot problems. This information is not intended to replace advice given to you by your health care provider. Make sure you discuss any questions you have with your health care provider. Document Revised: 02/15/2019 Document Reviewed: 06/26/2016 Elsevier Patient Education  2020 [employer] Inc[employer]  

## 2019-11-23 DIAGNOSIS — N182 Chronic kidney disease, stage 2 (mild): Secondary | ICD-10-CM | POA: Diagnosis not present

## 2019-11-23 DIAGNOSIS — E1142 Type 2 diabetes mellitus with diabetic polyneuropathy: Secondary | ICD-10-CM | POA: Diagnosis not present

## 2019-12-23 DIAGNOSIS — G301 Alzheimer's disease with late onset: Secondary | ICD-10-CM | POA: Diagnosis not present

## 2019-12-23 DIAGNOSIS — N182 Chronic kidney disease, stage 2 (mild): Secondary | ICD-10-CM | POA: Diagnosis not present

## 2020-01-23 DIAGNOSIS — I129 Hypertensive chronic kidney disease with stage 1 through stage 4 chronic kidney disease, or unspecified chronic kidney disease: Secondary | ICD-10-CM | POA: Diagnosis not present

## 2020-01-23 DIAGNOSIS — N1832 Chronic kidney disease, stage 3b: Secondary | ICD-10-CM | POA: Diagnosis not present

## 2020-01-23 DIAGNOSIS — D631 Anemia in chronic kidney disease: Secondary | ICD-10-CM | POA: Diagnosis not present

## 2020-01-23 DIAGNOSIS — I10 Essential (primary) hypertension: Secondary | ICD-10-CM | POA: Diagnosis not present

## 2020-01-23 DIAGNOSIS — R809 Proteinuria, unspecified: Secondary | ICD-10-CM | POA: Diagnosis not present

## 2020-01-23 DIAGNOSIS — E7849 Other hyperlipidemia: Secondary | ICD-10-CM | POA: Diagnosis not present

## 2020-01-23 DIAGNOSIS — Z79899 Other long term (current) drug therapy: Secondary | ICD-10-CM | POA: Diagnosis not present

## 2020-01-26 DIAGNOSIS — I129 Hypertensive chronic kidney disease with stage 1 through stage 4 chronic kidney disease, or unspecified chronic kidney disease: Secondary | ICD-10-CM | POA: Diagnosis not present

## 2020-01-26 DIAGNOSIS — N1832 Chronic kidney disease, stage 3b: Secondary | ICD-10-CM | POA: Diagnosis not present

## 2020-01-26 DIAGNOSIS — N189 Chronic kidney disease, unspecified: Secondary | ICD-10-CM | POA: Diagnosis not present

## 2020-01-26 DIAGNOSIS — D631 Anemia in chronic kidney disease: Secondary | ICD-10-CM | POA: Diagnosis not present

## 2020-01-26 DIAGNOSIS — E559 Vitamin D deficiency, unspecified: Secondary | ICD-10-CM | POA: Diagnosis not present

## 2020-02-23 ENCOUNTER — Other Ambulatory Visit: Payer: Self-pay

## 2020-02-23 ENCOUNTER — Ambulatory Visit: Payer: Medicare HMO | Admitting: Podiatry

## 2020-02-23 ENCOUNTER — Encounter: Payer: Self-pay | Admitting: Podiatry

## 2020-02-23 DIAGNOSIS — E1142 Type 2 diabetes mellitus with diabetic polyneuropathy: Secondary | ICD-10-CM | POA: Diagnosis not present

## 2020-02-23 DIAGNOSIS — M79674 Pain in right toe(s): Secondary | ICD-10-CM

## 2020-02-23 DIAGNOSIS — M79675 Pain in left toe(s): Secondary | ICD-10-CM

## 2020-02-23 DIAGNOSIS — N182 Chronic kidney disease, stage 2 (mild): Secondary | ICD-10-CM | POA: Diagnosis not present

## 2020-02-23 DIAGNOSIS — B351 Tinea unguium: Secondary | ICD-10-CM | POA: Diagnosis not present

## 2020-02-23 DIAGNOSIS — I10 Essential (primary) hypertension: Secondary | ICD-10-CM | POA: Diagnosis not present

## 2020-02-25 NOTE — Progress Notes (Signed)
Subjective: Annette Fitzgerald presents today at risk foot care. Pt has h/o NIDDM with chronic kidney disease and painful mycotic nails b/l that are difficult to trim. Pain interferes with ambulation. Aggravating factors include wearing enclosed shoe gear. Pain is relieved with periodic professional debridement.  Her daughter is present during today's visit. They voice no new pedal concerns on today's visit.  Avon Gully, MD is patient's PCP. Last visit was: 10/23/2019.  Past Medical History:  Diagnosis Date  . Diabetes mellitus without complication (HCC)   . Hypertension   . Neuropathy due to type 2 diabetes mellitus (HCC)      Current Outpatient Medications on File Prior to Visit  Medication Sig Dispense Refill  . ACCU-CHEK AVIVA PLUS test strip     . ACCU-CHEK SOFTCLIX LANCETS lancets     . Alcohol Swabs (B-D SINGLE USE SWABS REGULAR) PADS     . alendronate (FOSAMAX) 70 MG tablet Take 70 mg by mouth once a week.     . ALPRAZolam (XANAX) 1 MG tablet Take by mouth.    Marland Kitchen amLODipine (NORVASC) 10 MG tablet     . aspirin 81 MG chewable tablet Chew by mouth.    Marland Kitchen atorvastatin (LIPITOR) 10 MG tablet Take by mouth.    . BD PEN NEEDLE NANO 2ND GEN 32G X 4 MM MISC     . cholecalciferol (VITAMIN D) 25 MCG (1000 UT) tablet Take by mouth.    . donepezil (ARICEPT) 10 MG tablet Take by mouth.    . gabapentin (NEURONTIN) 300 MG capsule Take 300 mg by mouth 2 (two) times daily.     Marland Kitchen glipiZIDE (GLUCOTROL XL) 10 MG 24 hr tablet Take 10 mg by mouth daily with breakfast.    . hydrALAZINE (APRESOLINE) 25 MG tablet TAKE 1 TABLET BY MOUTH TWICE DAILY    . Insulin Glargine (BASAGLAR KWIKPEN) 100 UNIT/ML SOPN Inject 40 Units into the skin daily.    . iron polysaccharides (NU-IRON) 150 MG capsule Take by mouth.    . latanoprost (XALATAN) 0.005 % ophthalmic solution     . linagliptin (TRADJENTA) 5 MG TABS tablet Take 5 mg by mouth daily.    Marland Kitchen losartan (COZAAR) 25 MG tablet Take 12.5 mg by mouth daily.     . timolol (TIMOPTIC) 0.25 % ophthalmic solution PLACE 1 DROP INTO BOTH EYES BID     No current facility-administered medications on file prior to visit.     No Known Allergies  Objective: Annette Fitzgerald is a pleasant 86 y.o. y.o. Patient Race: Black or African American [2]  female in NAD. AAO x 3.  There were no vitals filed for this visit.  Vascular Examination: Neurovascular status unchanged b/l lower extremities. Capillary refill time to digits immediate b/l. Palpable DP pulses b/l. Palpable PT pulses b/l. Pedal hair absent b/l. Skin temperature gradient within normal limits b/l. No pain with calf compression b/l. No edema noted b/l.  Dermatological Examination: Pedal skin with normal turgor, texture and tone bilaterally. No open wounds bilaterally. No interdigital macerations bilaterally. Toenails 1-5 b/l elongated, discolored, dystrophic, thickened, crumbly with subungual debris and tenderness to dorsal palpation.  Musculoskeletal: Normal muscle strength 5/5 to all lower extremity muscle groups bilaterally. No pain crepitus or joint limitation noted with ROM b/l. No gross bony deformities bilaterally.  Neurological Examination: Pt has subjective symptoms of neuropathy. Protective sensation intact 5/5 intact bilaterally with 10g monofilament b/l. Vibratory sensation intact b/l.  Assessment: 1. Pain due to onychomycosis of toenails of  both feet   2. Diabetic peripheral neuropathy associated with type 2 diabetes mellitus (HCC)    Plan: -Examined patient. -No new findings. No new orders. -Toenails 1-5 b/l were debrided in length and girth with sterile nail nippers and dremel without iatrogenic bleeding. Continue diabetic foot care principles daily. -Patient to continue soft, supportive shoe gear daily. -Patient to report any pedal injuries to medical professional immediately. -Patient/POA to call should there be question/concern in the interim.  Return in about 3 months  (around 05/24/2020).  Freddie Breech, DPM

## 2020-03-24 DIAGNOSIS — I10 Essential (primary) hypertension: Secondary | ICD-10-CM | POA: Diagnosis not present

## 2020-03-24 DIAGNOSIS — E1142 Type 2 diabetes mellitus with diabetic polyneuropathy: Secondary | ICD-10-CM | POA: Diagnosis not present

## 2020-04-19 DIAGNOSIS — I129 Hypertensive chronic kidney disease with stage 1 through stage 4 chronic kidney disease, or unspecified chronic kidney disease: Secondary | ICD-10-CM | POA: Diagnosis not present

## 2020-04-19 DIAGNOSIS — N1832 Chronic kidney disease, stage 3b: Secondary | ICD-10-CM | POA: Diagnosis not present

## 2020-04-19 DIAGNOSIS — E559 Vitamin D deficiency, unspecified: Secondary | ICD-10-CM | POA: Diagnosis not present

## 2020-04-19 DIAGNOSIS — D631 Anemia in chronic kidney disease: Secondary | ICD-10-CM | POA: Diagnosis not present

## 2020-04-19 DIAGNOSIS — N189 Chronic kidney disease, unspecified: Secondary | ICD-10-CM | POA: Diagnosis not present

## 2020-04-22 DIAGNOSIS — Z1389 Encounter for screening for other disorder: Secondary | ICD-10-CM | POA: Diagnosis not present

## 2020-04-22 DIAGNOSIS — N182 Chronic kidney disease, stage 2 (mild): Secondary | ICD-10-CM | POA: Diagnosis not present

## 2020-04-22 DIAGNOSIS — Z0001 Encounter for general adult medical examination with abnormal findings: Secondary | ICD-10-CM | POA: Diagnosis not present

## 2020-04-22 DIAGNOSIS — I10 Essential (primary) hypertension: Secondary | ICD-10-CM | POA: Diagnosis not present

## 2020-04-22 DIAGNOSIS — E1142 Type 2 diabetes mellitus with diabetic polyneuropathy: Secondary | ICD-10-CM | POA: Diagnosis not present

## 2020-04-22 DIAGNOSIS — Z1331 Encounter for screening for depression: Secondary | ICD-10-CM | POA: Diagnosis not present

## 2020-04-23 DIAGNOSIS — E1142 Type 2 diabetes mellitus with diabetic polyneuropathy: Secondary | ICD-10-CM | POA: Diagnosis not present

## 2020-04-23 DIAGNOSIS — N182 Chronic kidney disease, stage 2 (mild): Secondary | ICD-10-CM | POA: Diagnosis not present

## 2020-04-23 DIAGNOSIS — I10 Essential (primary) hypertension: Secondary | ICD-10-CM | POA: Diagnosis not present

## 2020-04-23 DIAGNOSIS — Z0001 Encounter for general adult medical examination with abnormal findings: Secondary | ICD-10-CM | POA: Diagnosis not present

## 2020-04-26 DIAGNOSIS — E1122 Type 2 diabetes mellitus with diabetic chronic kidney disease: Secondary | ICD-10-CM | POA: Diagnosis not present

## 2020-04-26 DIAGNOSIS — I129 Hypertensive chronic kidney disease with stage 1 through stage 4 chronic kidney disease, or unspecified chronic kidney disease: Secondary | ICD-10-CM | POA: Diagnosis not present

## 2020-04-26 DIAGNOSIS — E1129 Type 2 diabetes mellitus with other diabetic kidney complication: Secondary | ICD-10-CM | POA: Diagnosis not present

## 2020-04-26 DIAGNOSIS — E559 Vitamin D deficiency, unspecified: Secondary | ICD-10-CM | POA: Diagnosis not present

## 2020-04-26 DIAGNOSIS — R809 Proteinuria, unspecified: Secondary | ICD-10-CM | POA: Diagnosis not present

## 2020-04-26 DIAGNOSIS — N189 Chronic kidney disease, unspecified: Secondary | ICD-10-CM | POA: Diagnosis not present

## 2020-05-15 DIAGNOSIS — E113293 Type 2 diabetes mellitus with mild nonproliferative diabetic retinopathy without macular edema, bilateral: Secondary | ICD-10-CM | POA: Diagnosis not present

## 2020-05-15 DIAGNOSIS — E113553 Type 2 diabetes mellitus with stable proliferative diabetic retinopathy, bilateral: Secondary | ICD-10-CM | POA: Diagnosis not present

## 2020-05-15 DIAGNOSIS — H3582 Retinal ischemia: Secondary | ICD-10-CM | POA: Diagnosis not present

## 2020-05-15 DIAGNOSIS — H31093 Other chorioretinal scars, bilateral: Secondary | ICD-10-CM | POA: Diagnosis not present

## 2020-05-15 DIAGNOSIS — Z961 Presence of intraocular lens: Secondary | ICD-10-CM | POA: Diagnosis not present

## 2020-05-15 DIAGNOSIS — H401132 Primary open-angle glaucoma, bilateral, moderate stage: Secondary | ICD-10-CM | POA: Diagnosis not present

## 2020-05-15 DIAGNOSIS — H53411 Scotoma involving central area, right eye: Secondary | ICD-10-CM | POA: Diagnosis not present

## 2020-05-22 DIAGNOSIS — N182 Chronic kidney disease, stage 2 (mild): Secondary | ICD-10-CM | POA: Diagnosis not present

## 2020-05-22 DIAGNOSIS — E1142 Type 2 diabetes mellitus with diabetic polyneuropathy: Secondary | ICD-10-CM | POA: Diagnosis not present

## 2020-05-27 ENCOUNTER — Ambulatory Visit: Payer: Medicare HMO | Admitting: Podiatry

## 2020-05-27 ENCOUNTER — Other Ambulatory Visit: Payer: Self-pay

## 2020-05-27 ENCOUNTER — Encounter: Payer: Self-pay | Admitting: Podiatry

## 2020-05-27 DIAGNOSIS — E1142 Type 2 diabetes mellitus with diabetic polyneuropathy: Secondary | ICD-10-CM | POA: Diagnosis not present

## 2020-05-27 DIAGNOSIS — M79675 Pain in left toe(s): Secondary | ICD-10-CM

## 2020-05-27 DIAGNOSIS — M79674 Pain in right toe(s): Secondary | ICD-10-CM

## 2020-05-27 DIAGNOSIS — B351 Tinea unguium: Secondary | ICD-10-CM | POA: Diagnosis not present

## 2020-06-02 NOTE — Progress Notes (Signed)
Subjective: Annette Fitzgerald presents today at risk foot care. Pt has h/o NIDDM with chronic kidney disease and painful mycotic nails b/l that are difficult to trim. Pain interferes with ambulation. Aggravating factors include wearing enclosed shoe gear. Pain is relieved with periodic professional debridement.  Patient states her blood glucose was 125 mg/dl on yesterday. She has not checked her blood sugar today.   Her daughter is present during today's visit. They voice no new pedal concerns on today's visit.  Avon Gully, MD is patient's PCP. Last visit was: 01/23/2020.  Past Medical History:  Diagnosis Date  . Diabetes mellitus without complication (HCC)   . Hypertension   . Neuropathy due to type 2 diabetes mellitus (HCC)      Current Outpatient Medications on File Prior to Visit  Medication Sig Dispense Refill  . ACCU-CHEK AVIVA PLUS test strip     . ACCU-CHEK SOFTCLIX LANCETS lancets     . Alcohol Swabs (B-D SINGLE USE SWABS REGULAR) PADS     . alendronate (FOSAMAX) 70 MG tablet Take 70 mg by mouth once a week.     . ALPRAZolam (XANAX) 1 MG tablet Take by mouth.    Marland Kitchen amLODipine (NORVASC) 10 MG tablet     . aspirin 81 MG chewable tablet Chew by mouth.    Marland Kitchen atorvastatin (LIPITOR) 10 MG tablet Take by mouth.    . BD PEN NEEDLE NANO 2ND GEN 32G X 4 MM MISC     . cholecalciferol (VITAMIN D) 25 MCG (1000 UT) tablet Take by mouth.    . donepezil (ARICEPT) 10 MG tablet Take by mouth.    . gabapentin (NEURONTIN) 300 MG capsule Take 300 mg by mouth 2 (two) times daily.     Marland Kitchen glipiZIDE (GLUCOTROL XL) 10 MG 24 hr tablet Take 10 mg by mouth daily with breakfast.    . hydrALAZINE (APRESOLINE) 25 MG tablet TAKE 1 TABLET BY MOUTH TWICE DAILY    . Insulin Glargine (BASAGLAR KWIKPEN) 100 UNIT/ML SOPN Inject 40 Units into the skin daily.    . iron polysaccharides (NU-IRON) 150 MG capsule Take by mouth.    . latanoprost (XALATAN) 0.005 % ophthalmic solution     . linagliptin (TRADJENTA) 5 MG  TABS tablet Take 5 mg by mouth daily.    Marland Kitchen losartan (COZAAR) 25 MG tablet Take 12.5 mg by mouth daily.    . timolol (TIMOPTIC) 0.25 % ophthalmic solution PLACE 1 DROP INTO BOTH EYES BID     No current facility-administered medications on file prior to visit.     No Known Allergies  Objective: Annette Fitzgerald is a pleasant 86 y.o. y.o. Patient Race: Black or African American [2]  female in NAD. AAO x 3.  There were no vitals filed for this visit.  Vascular Examination: Neurovascular status unchanged b/l lower extremities. Capillary refill time to digits immediate b/l. Palpable DP pulses b/l. Palpable PT pulses b/l. Pedal hair absent b/l. Skin temperature gradient within normal limits b/l. No pain with calf compression b/l. No edema noted b/l.  Dermatological Examination: Pedal skin with normal turgor, texture and tone bilaterally. No open wounds bilaterally. No interdigital macerations bilaterally. Toenails 1-5 b/l elongated, discolored, dystrophic, thickened, crumbly with subungual debris and tenderness to dorsal palpation.  Musculoskeletal: Normal muscle strength 5/5 to all lower extremity muscle groups bilaterally. No pain crepitus or joint limitation noted with ROM b/l. No gross bony deformities bilaterally.  Neurological Examination: Pt has subjective symptoms of neuropathy. Protective sensation intact 5/5  intact bilaterally with 10g monofilament b/l. Vibratory sensation intact b/l.  Assessment: 1. Pain due to onychomycosis of toenails of both feet   2. Diabetic peripheral neuropathy associated with type 2 diabetes mellitus (HCC)    Plan: -Examined patient. -No new findings. No new orders. -Toenails 1-5 b/l were debrided in length and girth with sterile nail nippers and dremel without iatrogenic bleeding. Continue diabetic foot care principles daily. -Patient to continue soft, supportive shoe gear daily. -Patient to report any pedal injuries to medical professional  immediately. -Patient/POA to call should there be question/concern in the interim.  Return in about 3 months (around 08/25/2020) for diabetic nail trim.  Freddie Breech, DPM

## 2020-06-28 DIAGNOSIS — E1142 Type 2 diabetes mellitus with diabetic polyneuropathy: Secondary | ICD-10-CM | POA: Diagnosis not present

## 2020-06-28 DIAGNOSIS — N182 Chronic kidney disease, stage 2 (mild): Secondary | ICD-10-CM | POA: Diagnosis not present

## 2020-07-06 DIAGNOSIS — Z1331 Encounter for screening for depression: Secondary | ICD-10-CM | POA: Diagnosis not present

## 2020-07-06 DIAGNOSIS — N182 Chronic kidney disease, stage 2 (mild): Secondary | ICD-10-CM | POA: Diagnosis not present

## 2020-07-06 DIAGNOSIS — E1142 Type 2 diabetes mellitus with diabetic polyneuropathy: Secondary | ICD-10-CM | POA: Diagnosis not present

## 2020-07-06 DIAGNOSIS — Z0001 Encounter for general adult medical examination with abnormal findings: Secondary | ICD-10-CM | POA: Diagnosis not present

## 2020-07-06 DIAGNOSIS — F411 Generalized anxiety disorder: Secondary | ICD-10-CM | POA: Diagnosis not present

## 2020-07-06 DIAGNOSIS — I10 Essential (primary) hypertension: Secondary | ICD-10-CM | POA: Diagnosis not present

## 2020-07-06 DIAGNOSIS — Z23 Encounter for immunization: Secondary | ICD-10-CM | POA: Diagnosis not present

## 2020-07-06 DIAGNOSIS — Z1389 Encounter for screening for other disorder: Secondary | ICD-10-CM | POA: Diagnosis not present

## 2020-07-26 DIAGNOSIS — I129 Hypertensive chronic kidney disease with stage 1 through stage 4 chronic kidney disease, or unspecified chronic kidney disease: Secondary | ICD-10-CM | POA: Diagnosis not present

## 2020-07-26 DIAGNOSIS — E559 Vitamin D deficiency, unspecified: Secondary | ICD-10-CM | POA: Diagnosis not present

## 2020-07-26 DIAGNOSIS — R809 Proteinuria, unspecified: Secondary | ICD-10-CM | POA: Diagnosis not present

## 2020-07-26 DIAGNOSIS — N1832 Chronic kidney disease, stage 3b: Secondary | ICD-10-CM | POA: Diagnosis not present

## 2020-07-29 DIAGNOSIS — E1142 Type 2 diabetes mellitus with diabetic polyneuropathy: Secondary | ICD-10-CM | POA: Diagnosis not present

## 2020-07-29 DIAGNOSIS — N182 Chronic kidney disease, stage 2 (mild): Secondary | ICD-10-CM | POA: Diagnosis not present

## 2020-07-29 DIAGNOSIS — I10 Essential (primary) hypertension: Secondary | ICD-10-CM | POA: Diagnosis not present

## 2020-08-02 DIAGNOSIS — R809 Proteinuria, unspecified: Secondary | ICD-10-CM | POA: Diagnosis not present

## 2020-08-02 DIAGNOSIS — E1122 Type 2 diabetes mellitus with diabetic chronic kidney disease: Secondary | ICD-10-CM | POA: Diagnosis not present

## 2020-08-02 DIAGNOSIS — N189 Chronic kidney disease, unspecified: Secondary | ICD-10-CM | POA: Diagnosis not present

## 2020-08-02 DIAGNOSIS — E1129 Type 2 diabetes mellitus with other diabetic kidney complication: Secondary | ICD-10-CM | POA: Diagnosis not present

## 2020-08-02 DIAGNOSIS — I129 Hypertensive chronic kidney disease with stage 1 through stage 4 chronic kidney disease, or unspecified chronic kidney disease: Secondary | ICD-10-CM | POA: Diagnosis not present

## 2020-08-02 DIAGNOSIS — E559 Vitamin D deficiency, unspecified: Secondary | ICD-10-CM | POA: Diagnosis not present

## 2020-08-26 DIAGNOSIS — I10 Essential (primary) hypertension: Secondary | ICD-10-CM | POA: Diagnosis not present

## 2020-08-26 DIAGNOSIS — E1142 Type 2 diabetes mellitus with diabetic polyneuropathy: Secondary | ICD-10-CM | POA: Diagnosis not present

## 2020-08-28 ENCOUNTER — Ambulatory Visit: Payer: Medicare HMO | Admitting: Podiatry

## 2020-08-28 ENCOUNTER — Other Ambulatory Visit: Payer: Self-pay

## 2020-08-28 DIAGNOSIS — B351 Tinea unguium: Secondary | ICD-10-CM | POA: Diagnosis not present

## 2020-08-28 DIAGNOSIS — M79674 Pain in right toe(s): Secondary | ICD-10-CM

## 2020-08-28 DIAGNOSIS — E1142 Type 2 diabetes mellitus with diabetic polyneuropathy: Secondary | ICD-10-CM | POA: Diagnosis not present

## 2020-08-28 DIAGNOSIS — M79675 Pain in left toe(s): Secondary | ICD-10-CM | POA: Diagnosis not present

## 2020-08-28 NOTE — Progress Notes (Signed)
Subjective: Annette Fitzgerald presents today at risk foot care. Pt has h/o NIDDM with chronic kidney disease and painful mycotic nails b/l that are difficult to trim. Pain interferes with ambulation. Aggravating factors include wearing enclosed shoe gear. Pain is relieved with periodic professional debridement.  Patient states her blood glucose was 125 mg/dl on yesterday. She has not checked her blood sugar today.   Her daughter is present during today's visit. They voice no new pedal concerns on today's visit.  Annette Gully, MD is patient's PCP. Last visit was: 06/28/2020  No Known Allergies  Objective: Annette Fitzgerald is a pleasant 87 y.o.  African American female in NAD. AAO x 3.   There were no vitals filed for this visit.  Vascular Examination: Neurovascular status unchanged b/l lower extremities. Capillary refill time to digits immediate b/l. Palpable DP pulses b/l. Palpable PT pulses b/l. Pedal hair absent b/l. Skin temperature gradient within normal limits b/l. No pain with calf compression b/l. No edema noted b/l.  Dermatological Examination: Pedal skin with normal turgor, texture and tone bilaterally. No open wounds bilaterally. No interdigital macerations bilaterally. Toenails 1-5 b/l elongated, discolored, dystrophic, thickened, crumbly with subungual debris and tenderness to dorsal palpation.  Musculoskeletal: Normal muscle strength 5/5 to all lower extremity muscle groups bilaterally. No pain crepitus or joint limitation noted with ROM b/l. No gross bony deformities bilaterally.  Neurological Examination: Pt has subjective symptoms of neuropathy. Protective sensation intact 5/5 intact bilaterally with 10g monofilament b/l. Vibratory sensation intact b/l.  Assessment: 1. Pain due to onychomycosis of toenails of both feet   2. Diabetic peripheral neuropathy associated with type 2 diabetes mellitus (HCC)    Plan: -Examined patient. -No new findings. No new  orders. -Toenails 1-5 b/l were debrided in length and girth with sterile nail nippers and dremel without iatrogenic bleeding. Continue diabetic foot care principles daily. -Patient to continue soft, supportive shoe gear daily. -Patient to report any pedal injuries to medical professional immediately. -Patient/POA to call should there be question/concern in the interim.  Return in about 3 months (around 11/28/2020) for nail trim.  Freddie Breech, DPM

## 2020-09-01 ENCOUNTER — Encounter: Payer: Self-pay | Admitting: Podiatry

## 2020-09-26 DIAGNOSIS — E1142 Type 2 diabetes mellitus with diabetic polyneuropathy: Secondary | ICD-10-CM | POA: Diagnosis not present

## 2020-09-26 DIAGNOSIS — N182 Chronic kidney disease, stage 2 (mild): Secondary | ICD-10-CM | POA: Diagnosis not present

## 2020-10-07 DIAGNOSIS — F411 Generalized anxiety disorder: Secondary | ICD-10-CM | POA: Diagnosis not present

## 2020-10-07 DIAGNOSIS — N182 Chronic kidney disease, stage 2 (mild): Secondary | ICD-10-CM | POA: Diagnosis not present

## 2020-10-07 DIAGNOSIS — I10 Essential (primary) hypertension: Secondary | ICD-10-CM | POA: Diagnosis not present

## 2020-10-07 DIAGNOSIS — E1142 Type 2 diabetes mellitus with diabetic polyneuropathy: Secondary | ICD-10-CM | POA: Diagnosis not present

## 2020-10-08 DIAGNOSIS — N182 Chronic kidney disease, stage 2 (mild): Secondary | ICD-10-CM | POA: Diagnosis not present

## 2020-10-08 DIAGNOSIS — E1142 Type 2 diabetes mellitus with diabetic polyneuropathy: Secondary | ICD-10-CM | POA: Diagnosis not present

## 2020-10-08 DIAGNOSIS — E7849 Other hyperlipidemia: Secondary | ICD-10-CM | POA: Diagnosis not present

## 2020-11-05 DIAGNOSIS — E559 Vitamin D deficiency, unspecified: Secondary | ICD-10-CM | POA: Diagnosis not present

## 2020-11-05 DIAGNOSIS — E1129 Type 2 diabetes mellitus with other diabetic kidney complication: Secondary | ICD-10-CM | POA: Diagnosis not present

## 2020-11-05 DIAGNOSIS — R809 Proteinuria, unspecified: Secondary | ICD-10-CM | POA: Diagnosis not present

## 2020-11-05 DIAGNOSIS — E1122 Type 2 diabetes mellitus with diabetic chronic kidney disease: Secondary | ICD-10-CM | POA: Diagnosis not present

## 2020-11-05 DIAGNOSIS — I129 Hypertensive chronic kidney disease with stage 1 through stage 4 chronic kidney disease, or unspecified chronic kidney disease: Secondary | ICD-10-CM | POA: Diagnosis not present

## 2020-11-05 DIAGNOSIS — N189 Chronic kidney disease, unspecified: Secondary | ICD-10-CM | POA: Diagnosis not present

## 2020-11-07 DIAGNOSIS — I10 Essential (primary) hypertension: Secondary | ICD-10-CM | POA: Diagnosis not present

## 2020-11-07 DIAGNOSIS — N182 Chronic kidney disease, stage 2 (mild): Secondary | ICD-10-CM | POA: Diagnosis not present

## 2020-11-13 DIAGNOSIS — E113593 Type 2 diabetes mellitus with proliferative diabetic retinopathy without macular edema, bilateral: Secondary | ICD-10-CM | POA: Diagnosis not present

## 2020-11-13 DIAGNOSIS — H401132 Primary open-angle glaucoma, bilateral, moderate stage: Secondary | ICD-10-CM | POA: Diagnosis not present

## 2020-11-13 DIAGNOSIS — H31093 Other chorioretinal scars, bilateral: Secondary | ICD-10-CM | POA: Diagnosis not present

## 2020-11-13 DIAGNOSIS — H3582 Retinal ischemia: Secondary | ICD-10-CM | POA: Diagnosis not present

## 2020-11-13 DIAGNOSIS — Z961 Presence of intraocular lens: Secondary | ICD-10-CM | POA: Diagnosis not present

## 2020-11-14 DIAGNOSIS — E559 Vitamin D deficiency, unspecified: Secondary | ICD-10-CM | POA: Diagnosis not present

## 2020-11-14 DIAGNOSIS — R809 Proteinuria, unspecified: Secondary | ICD-10-CM | POA: Diagnosis not present

## 2020-11-14 DIAGNOSIS — E1129 Type 2 diabetes mellitus with other diabetic kidney complication: Secondary | ICD-10-CM | POA: Diagnosis not present

## 2020-11-14 DIAGNOSIS — E1122 Type 2 diabetes mellitus with diabetic chronic kidney disease: Secondary | ICD-10-CM | POA: Diagnosis not present

## 2020-11-14 DIAGNOSIS — I129 Hypertensive chronic kidney disease with stage 1 through stage 4 chronic kidney disease, or unspecified chronic kidney disease: Secondary | ICD-10-CM | POA: Diagnosis not present

## 2020-11-14 DIAGNOSIS — N189 Chronic kidney disease, unspecified: Secondary | ICD-10-CM | POA: Diagnosis not present

## 2020-12-07 DIAGNOSIS — I10 Essential (primary) hypertension: Secondary | ICD-10-CM | POA: Diagnosis not present

## 2020-12-07 DIAGNOSIS — E1142 Type 2 diabetes mellitus with diabetic polyneuropathy: Secondary | ICD-10-CM | POA: Diagnosis not present

## 2020-12-10 ENCOUNTER — Ambulatory Visit: Payer: Medicare HMO | Admitting: Podiatry

## 2020-12-10 ENCOUNTER — Other Ambulatory Visit: Payer: Self-pay

## 2020-12-10 DIAGNOSIS — M79675 Pain in left toe(s): Secondary | ICD-10-CM | POA: Diagnosis not present

## 2020-12-10 DIAGNOSIS — B351 Tinea unguium: Secondary | ICD-10-CM | POA: Diagnosis not present

## 2020-12-10 DIAGNOSIS — M79674 Pain in right toe(s): Secondary | ICD-10-CM | POA: Diagnosis not present

## 2020-12-10 DIAGNOSIS — E1142 Type 2 diabetes mellitus with diabetic polyneuropathy: Secondary | ICD-10-CM | POA: Diagnosis not present

## 2020-12-14 ENCOUNTER — Encounter: Payer: Self-pay | Admitting: Podiatry

## 2020-12-14 NOTE — Progress Notes (Signed)
Subjective: Annette Fitzgerald is a pleasant 87 y.o. female patient seen today for at risk foot care. She has h/o NIDDM with CKD. She is seen for painful thick toenails that are difficult to trim. Pain interferes with ambulation. Aggravating factors include wearing enclosed shoe gear. Pain is relieved with periodic professional debridement.  Her daughter is present during today's visit. They state [name]'s blood glucose was "200-something" this morning.  PCP is Avon Gully, MD. Last visit was: 09/26/2020.  No Known Allergies  Objective: Physical Exam  General: TONNIE Fitzgerald is a pleasant 87 y.o. African American female, WD, WN in NAD. AAO x 3.   Vascular:  Capillary refill time to digits immediate b/l. Palpable pedal pulses b/l LE. Pedal hair absent. Lower extremity skin temperature gradient within normal limits. No pain with calf compression b/l. No edema noted b/l lower extremities.  Dermatological:  Pedal skin with normal turgor, texture and tone b/l lower extremities No open wounds b/l lower extremities No interdigital macerations b/l lower extremities Toenails 1-5 b/l elongated, discolored, dystrophic, thickened, crumbly with subungual debris and tenderness to dorsal palpation.  Musculoskeletal:  Normal muscle strength 5/5 to all lower extremity muscle groups bilaterally. No pain crepitus or joint limitation noted with ROM b/l. No gross bony deformities bilaterally.  Neurological:  Pt has subjective symptoms of neuropathy. Protective sensation intact 5/5 intact bilaterally with 10g monofilament b/l. Vibratory sensation intact b/l.  Assessment and Plan:  1. Pain due to onychomycosis of toenails of both feet   2. Diabetic peripheral neuropathy associated with type 2 diabetes mellitus (HCC)     -Continue diabetic foot care principles. -Patient to continue soft, supportive shoe gear daily. -Toenails 1-5 b/l were debrided in length and girth with sterile nail nippers and  dremel without iatrogenic bleeding.  -Patient to report any pedal injuries to medical professional immediately. -Patient/POA to call should there be question/concern in the interim.  Return in about 3 months (around 03/12/2021).  Freddie Breech, DPM

## 2021-01-07 DIAGNOSIS — E1142 Type 2 diabetes mellitus with diabetic polyneuropathy: Secondary | ICD-10-CM | POA: Diagnosis not present

## 2021-01-07 DIAGNOSIS — I10 Essential (primary) hypertension: Secondary | ICD-10-CM | POA: Diagnosis not present

## 2021-02-07 DIAGNOSIS — E1142 Type 2 diabetes mellitus with diabetic polyneuropathy: Secondary | ICD-10-CM | POA: Diagnosis not present

## 2021-02-07 DIAGNOSIS — I10 Essential (primary) hypertension: Secondary | ICD-10-CM | POA: Diagnosis not present

## 2021-02-20 DIAGNOSIS — E1122 Type 2 diabetes mellitus with diabetic chronic kidney disease: Secondary | ICD-10-CM | POA: Diagnosis not present

## 2021-02-20 DIAGNOSIS — E1129 Type 2 diabetes mellitus with other diabetic kidney complication: Secondary | ICD-10-CM | POA: Diagnosis not present

## 2021-02-20 DIAGNOSIS — E559 Vitamin D deficiency, unspecified: Secondary | ICD-10-CM | POA: Diagnosis not present

## 2021-02-20 DIAGNOSIS — N189 Chronic kidney disease, unspecified: Secondary | ICD-10-CM | POA: Diagnosis not present

## 2021-02-20 DIAGNOSIS — R809 Proteinuria, unspecified: Secondary | ICD-10-CM | POA: Diagnosis not present

## 2021-02-20 DIAGNOSIS — I129 Hypertensive chronic kidney disease with stage 1 through stage 4 chronic kidney disease, or unspecified chronic kidney disease: Secondary | ICD-10-CM | POA: Diagnosis not present

## 2021-02-28 DIAGNOSIS — E1122 Type 2 diabetes mellitus with diabetic chronic kidney disease: Secondary | ICD-10-CM | POA: Diagnosis not present

## 2021-02-28 DIAGNOSIS — I129 Hypertensive chronic kidney disease with stage 1 through stage 4 chronic kidney disease, or unspecified chronic kidney disease: Secondary | ICD-10-CM | POA: Diagnosis not present

## 2021-02-28 DIAGNOSIS — N189 Chronic kidney disease, unspecified: Secondary | ICD-10-CM | POA: Diagnosis not present

## 2021-02-28 DIAGNOSIS — R809 Proteinuria, unspecified: Secondary | ICD-10-CM | POA: Diagnosis not present

## 2021-02-28 DIAGNOSIS — E559 Vitamin D deficiency, unspecified: Secondary | ICD-10-CM | POA: Diagnosis not present

## 2021-02-28 DIAGNOSIS — E1129 Type 2 diabetes mellitus with other diabetic kidney complication: Secondary | ICD-10-CM | POA: Diagnosis not present

## 2021-03-09 DIAGNOSIS — I10 Essential (primary) hypertension: Secondary | ICD-10-CM | POA: Diagnosis not present

## 2021-03-09 DIAGNOSIS — E1142 Type 2 diabetes mellitus with diabetic polyneuropathy: Secondary | ICD-10-CM | POA: Diagnosis not present

## 2021-03-24 ENCOUNTER — Other Ambulatory Visit: Payer: Self-pay

## 2021-03-24 ENCOUNTER — Ambulatory Visit (INDEPENDENT_AMBULATORY_CARE_PROVIDER_SITE_OTHER): Payer: Medicare HMO | Admitting: Podiatry

## 2021-03-24 ENCOUNTER — Encounter: Payer: Self-pay | Admitting: Podiatry

## 2021-03-24 DIAGNOSIS — B351 Tinea unguium: Secondary | ICD-10-CM

## 2021-03-24 DIAGNOSIS — M79674 Pain in right toe(s): Secondary | ICD-10-CM

## 2021-03-24 DIAGNOSIS — M79675 Pain in left toe(s): Secondary | ICD-10-CM

## 2021-03-24 DIAGNOSIS — E1142 Type 2 diabetes mellitus with diabetic polyneuropathy: Secondary | ICD-10-CM

## 2021-03-29 NOTE — Progress Notes (Signed)
  Subjective:  [name], female    DOB: 07-Dec-1932,  MRN: [id number]  Annette Fitzgerald presents to clinic today for at risk foot care. Pt has h/o NIDDM with chronic kidney disease and thick, elongated toenails b/l feet which are tender when wearing enclosed shoe gear.  Patient states blood glucose was 97 mg/dl yesterday morning.  Patient did not check blood glucose today.  Her daughter is present during today's visit. They voice no new pedal concerns on today's visit.  PCP is Avon Gully, MD , and last visit was 02/07/2021.  No Known Allergies  Review of Systems: Negative except as noted in the HPI. Objective:   Constitutional Annette Fitzgerald is a pleasant 87 y.o. African American female, WD, WN in NAD. AAO x 3.   Vascular Neurovascular status unchanged b/l lower extremities. Capillary refill time to digits immediate b/l. Palpable DP pulse(s) b/l lower extremities Palpable PT pulse(s) b/l lower extremities Pedal hair absent. Lower extremity skin temperature gradient within normal limits. No pain with calf compression b/l. No edema noted b/l lower extremities. No cyanosis or clubbing noted.  Neurologic Normal speech. Oriented to person, place, and time. Pt has subjective symptoms of neuropathy. Protective sensation intact 5/5 intact bilaterally with 10g monofilament b/l.  Dermatologic Skin warm and supple b/l lower extremities. No open wounds b/l LE. No interdigital macerations b/l lower extremities. Toenails 1-5 b/l elongated, discolored, dystrophic, thickened, crumbly with subungual debris and tenderness to dorsal palpation.  Orthopedic: Normal muscle strength 5/5 to all lower extremity muscle groups bilaterally. No pain crepitus or joint limitation noted with ROM b/l lower extremities. No gross bony deformities b/l lower extremities.   Radiographs: None Assessment:   1. Pain due to onychomycosis of toenails of both feet   2. Diabetic peripheral neuropathy associated  with type 2 diabetes mellitus (HCC)    Plan:  Patient was evaluated and treated and all questions answered. Consent given for treatment as described below: -Continue diabetic foot care principles: inspect feet daily, monitor glucose as recommended by PCP and/or Endocrinologist, and follow prescribed diet per PCP, Endocrinologist and/or dietician. -Toenails 1-5 b/l were debrided in length and girth with sterile nail nippers and dremel without iatrogenic bleeding.  -Patient to report any pedal injuries to medical professional immediately. -Patient/POA to call should there be question/concern in the interim.  Return in about 3 months (around 06/24/2021).  Freddie Breech, DPM

## 2021-05-05 DIAGNOSIS — N189 Chronic kidney disease, unspecified: Secondary | ICD-10-CM | POA: Diagnosis not present

## 2021-05-05 DIAGNOSIS — R809 Proteinuria, unspecified: Secondary | ICD-10-CM | POA: Diagnosis not present

## 2021-05-05 DIAGNOSIS — I129 Hypertensive chronic kidney disease with stage 1 through stage 4 chronic kidney disease, or unspecified chronic kidney disease: Secondary | ICD-10-CM | POA: Diagnosis not present

## 2021-05-05 DIAGNOSIS — E1122 Type 2 diabetes mellitus with diabetic chronic kidney disease: Secondary | ICD-10-CM | POA: Diagnosis not present

## 2021-05-05 DIAGNOSIS — E559 Vitamin D deficiency, unspecified: Secondary | ICD-10-CM | POA: Diagnosis not present

## 2021-05-05 DIAGNOSIS — E1129 Type 2 diabetes mellitus with other diabetic kidney complication: Secondary | ICD-10-CM | POA: Diagnosis not present

## 2021-05-06 DIAGNOSIS — I10 Essential (primary) hypertension: Secondary | ICD-10-CM | POA: Diagnosis not present

## 2021-05-06 DIAGNOSIS — Z23 Encounter for immunization: Secondary | ICD-10-CM | POA: Diagnosis not present

## 2021-05-06 DIAGNOSIS — E1142 Type 2 diabetes mellitus with diabetic polyneuropathy: Secondary | ICD-10-CM | POA: Diagnosis not present

## 2021-05-06 DIAGNOSIS — Z1331 Encounter for screening for depression: Secondary | ICD-10-CM | POA: Diagnosis not present

## 2021-05-06 DIAGNOSIS — Z0001 Encounter for general adult medical examination with abnormal findings: Secondary | ICD-10-CM | POA: Diagnosis not present

## 2021-05-06 DIAGNOSIS — Z1389 Encounter for screening for other disorder: Secondary | ICD-10-CM | POA: Diagnosis not present

## 2021-05-06 DIAGNOSIS — N182 Chronic kidney disease, stage 2 (mild): Secondary | ICD-10-CM | POA: Diagnosis not present

## 2021-05-09 DIAGNOSIS — E1122 Type 2 diabetes mellitus with diabetic chronic kidney disease: Secondary | ICD-10-CM | POA: Diagnosis not present

## 2021-05-09 DIAGNOSIS — E559 Vitamin D deficiency, unspecified: Secondary | ICD-10-CM | POA: Diagnosis not present

## 2021-05-09 DIAGNOSIS — E1129 Type 2 diabetes mellitus with other diabetic kidney complication: Secondary | ICD-10-CM | POA: Diagnosis not present

## 2021-05-09 DIAGNOSIS — N189 Chronic kidney disease, unspecified: Secondary | ICD-10-CM | POA: Diagnosis not present

## 2021-05-09 DIAGNOSIS — R809 Proteinuria, unspecified: Secondary | ICD-10-CM | POA: Diagnosis not present

## 2021-05-09 DIAGNOSIS — I129 Hypertensive chronic kidney disease with stage 1 through stage 4 chronic kidney disease, or unspecified chronic kidney disease: Secondary | ICD-10-CM | POA: Diagnosis not present

## 2021-05-15 DIAGNOSIS — Z961 Presence of intraocular lens: Secondary | ICD-10-CM | POA: Diagnosis not present

## 2021-05-15 DIAGNOSIS — H3582 Retinal ischemia: Secondary | ICD-10-CM | POA: Diagnosis not present

## 2021-05-15 DIAGNOSIS — E113593 Type 2 diabetes mellitus with proliferative diabetic retinopathy without macular edema, bilateral: Secondary | ICD-10-CM | POA: Diagnosis not present

## 2021-05-15 DIAGNOSIS — H31093 Other chorioretinal scars, bilateral: Secondary | ICD-10-CM | POA: Diagnosis not present

## 2021-05-15 DIAGNOSIS — H401132 Primary open-angle glaucoma, bilateral, moderate stage: Secondary | ICD-10-CM | POA: Diagnosis not present

## 2021-05-20 ENCOUNTER — Inpatient Hospital Stay (HOSPITAL_COMMUNITY)
Admission: EM | Admit: 2021-05-20 | Discharge: 2021-05-24 | DRG: 308 | Disposition: A | Discharge status: Home Health | Payer: Medicare HMO | Attending: Family Medicine | Admitting: Family Medicine

## 2021-05-20 ENCOUNTER — Other Ambulatory Visit: Payer: Self-pay

## 2021-05-20 ENCOUNTER — Encounter (HOSPITAL_COMMUNITY): Payer: Self-pay | Admitting: *Deleted

## 2021-05-20 ENCOUNTER — Emergency Department (HOSPITAL_COMMUNITY): Payer: Medicare HMO

## 2021-05-20 DIAGNOSIS — T380X5A Adverse effect of glucocorticoids and synthetic analogues, initial encounter: Secondary | ICD-10-CM | POA: Diagnosis not present

## 2021-05-20 DIAGNOSIS — E119 Type 2 diabetes mellitus without complications: Secondary | ICD-10-CM

## 2021-05-20 DIAGNOSIS — Z20822 Contact with and (suspected) exposure to covid-19: Secondary | ICD-10-CM | POA: Diagnosis present

## 2021-05-20 DIAGNOSIS — E1165 Type 2 diabetes mellitus with hyperglycemia: Secondary | ICD-10-CM | POA: Diagnosis not present

## 2021-05-20 DIAGNOSIS — Z79899 Other long term (current) drug therapy: Secondary | ICD-10-CM | POA: Diagnosis not present

## 2021-05-20 DIAGNOSIS — E1122 Type 2 diabetes mellitus with diabetic chronic kidney disease: Secondary | ICD-10-CM | POA: Diagnosis not present

## 2021-05-20 DIAGNOSIS — I517 Cardiomegaly: Secondary | ICD-10-CM | POA: Diagnosis not present

## 2021-05-20 DIAGNOSIS — N179 Acute kidney failure, unspecified: Secondary | ICD-10-CM | POA: Diagnosis not present

## 2021-05-20 DIAGNOSIS — I10 Essential (primary) hypertension: Secondary | ICD-10-CM | POA: Diagnosis present

## 2021-05-20 DIAGNOSIS — Z8249 Family history of ischemic heart disease and other diseases of the circulatory system: Secondary | ICD-10-CM | POA: Diagnosis not present

## 2021-05-20 DIAGNOSIS — R0789 Other chest pain: Secondary | ICD-10-CM | POA: Diagnosis not present

## 2021-05-20 DIAGNOSIS — J9811 Atelectasis: Secondary | ICD-10-CM | POA: Diagnosis present

## 2021-05-20 DIAGNOSIS — J9601 Acute respiratory failure with hypoxia: Secondary | ICD-10-CM | POA: Diagnosis present

## 2021-05-20 DIAGNOSIS — I495 Sick sinus syndrome: Secondary | ICD-10-CM | POA: Diagnosis not present

## 2021-05-20 DIAGNOSIS — R0602 Shortness of breath: Secondary | ICD-10-CM | POA: Diagnosis not present

## 2021-05-20 DIAGNOSIS — I4891 Unspecified atrial fibrillation: Secondary | ICD-10-CM | POA: Diagnosis not present

## 2021-05-20 DIAGNOSIS — I5033 Acute on chronic diastolic (congestive) heart failure: Secondary | ICD-10-CM | POA: Diagnosis present

## 2021-05-20 DIAGNOSIS — R0902 Hypoxemia: Secondary | ICD-10-CM

## 2021-05-20 DIAGNOSIS — I130 Hypertensive heart and chronic kidney disease with heart failure and stage 1 through stage 4 chronic kidney disease, or unspecified chronic kidney disease: Secondary | ICD-10-CM | POA: Diagnosis not present

## 2021-05-20 DIAGNOSIS — Z7983 Long term (current) use of bisphosphonates: Secondary | ICD-10-CM | POA: Diagnosis not present

## 2021-05-20 DIAGNOSIS — Z7984 Long term (current) use of oral hypoglycemic drugs: Secondary | ICD-10-CM

## 2021-05-20 DIAGNOSIS — I447 Left bundle-branch block, unspecified: Secondary | ICD-10-CM | POA: Diagnosis not present

## 2021-05-20 DIAGNOSIS — J439 Emphysema, unspecified: Secondary | ICD-10-CM | POA: Diagnosis present

## 2021-05-20 DIAGNOSIS — D72829 Elevated white blood cell count, unspecified: Secondary | ICD-10-CM | POA: Diagnosis not present

## 2021-05-20 DIAGNOSIS — E1140 Type 2 diabetes mellitus with diabetic neuropathy, unspecified: Secondary | ICD-10-CM | POA: Diagnosis present

## 2021-05-20 DIAGNOSIS — N1831 Chronic kidney disease, stage 3a: Secondary | ICD-10-CM | POA: Diagnosis present

## 2021-05-20 DIAGNOSIS — R079 Chest pain, unspecified: Secondary | ICD-10-CM | POA: Diagnosis not present

## 2021-05-20 DIAGNOSIS — Z7982 Long term (current) use of aspirin: Secondary | ICD-10-CM | POA: Diagnosis not present

## 2021-05-20 DIAGNOSIS — Z794 Long term (current) use of insulin: Secondary | ICD-10-CM | POA: Diagnosis not present

## 2021-05-20 DIAGNOSIS — F0390 Unspecified dementia without behavioral disturbance: Secondary | ICD-10-CM | POA: Diagnosis not present

## 2021-05-20 DIAGNOSIS — I480 Paroxysmal atrial fibrillation: Secondary | ICD-10-CM | POA: Diagnosis not present

## 2021-05-20 DIAGNOSIS — R0689 Other abnormalities of breathing: Secondary | ICD-10-CM | POA: Diagnosis not present

## 2021-05-20 LAB — GLUCOSE, CAPILLARY: Glucose-Capillary: 148 mg/dL — ABNORMAL HIGH (ref 70–99)

## 2021-05-20 LAB — CBC WITH DIFFERENTIAL/PLATELET
Abs Immature Granulocytes: 0.03 10*3/uL (ref 0.00–0.07)
Basophils Absolute: 0 10*3/uL (ref 0.0–0.1)
Basophils Relative: 0 %
Eosinophils Absolute: 0.2 10*3/uL (ref 0.0–0.5)
Eosinophils Relative: 2 %
HCT: 39.1 % (ref 36.0–46.0)
Hemoglobin: 12.5 g/dL (ref 12.0–15.0)
Immature Granulocytes: 0 %
Lymphocytes Relative: 4 %
Lymphs Abs: 0.6 10*3/uL — ABNORMAL LOW (ref 0.7–4.0)
MCH: 30.9 pg (ref 26.0–34.0)
MCHC: 32 g/dL (ref 30.0–36.0)
MCV: 96.5 fL (ref 80.0–100.0)
Monocytes Absolute: 0.6 10*3/uL (ref 0.1–1.0)
Monocytes Relative: 5 %
Neutro Abs: 11.1 10*3/uL — ABNORMAL HIGH (ref 1.7–7.7)
Neutrophils Relative %: 89 %
Platelets: 272 10*3/uL (ref 150–400)
RBC: 4.05 MIL/uL (ref 3.87–5.11)
RDW: 14.3 % (ref 11.5–15.5)
WBC: 12.5 10*3/uL — ABNORMAL HIGH (ref 4.0–10.5)
nRBC: 0 % (ref 0.0–0.2)

## 2021-05-20 LAB — COMPREHENSIVE METABOLIC PANEL
ALT: 16 U/L (ref 0–44)
AST: 21 U/L (ref 15–41)
Albumin: 3.9 g/dL (ref 3.5–5.0)
Alkaline Phosphatase: 95 U/L (ref 38–126)
Anion gap: 10 (ref 5–15)
BUN: 21 mg/dL (ref 8–23)
CO2: 24 mmol/L (ref 22–32)
Calcium: 9.2 mg/dL (ref 8.9–10.3)
Chloride: 101 mmol/L (ref 98–111)
Creatinine, Ser: 1.18 mg/dL — ABNORMAL HIGH (ref 0.44–1.00)
GFR, Estimated: 45 mL/min — ABNORMAL LOW (ref >60)
Glucose, Bld: 170 mg/dL — ABNORMAL HIGH (ref 70–99)
Potassium: 4.2 mmol/L (ref 3.5–5.1)
Sodium: 135 mmol/L (ref 135–145)
Total Bilirubin: 0.9 mg/dL (ref 0.3–1.2)
Total Protein: 8.6 g/dL — ABNORMAL HIGH (ref 6.5–8.1)

## 2021-05-20 LAB — RESP PANEL BY RT-PCR (FLU A&B, COVID) ARPGX2
Influenza A by PCR: NEGATIVE
Influenza B by PCR: NEGATIVE
SARS Coronavirus 2 by RT PCR: NEGATIVE

## 2021-05-20 LAB — LIPASE, BLOOD: Lipase: 43 U/L (ref 11–51)

## 2021-05-20 LAB — TROPONIN I (HIGH SENSITIVITY)
Troponin I (High Sensitivity): 5 ng/L (ref <18)
Troponin I (High Sensitivity): 5 ng/L (ref <18)
Troponin I (High Sensitivity): 6 ng/L (ref <18)

## 2021-05-20 LAB — BRAIN NATRIURETIC PEPTIDE: B Natriuretic Peptide: 88 pg/mL (ref 0.0–100.0)

## 2021-05-20 MED ORDER — ASPIRIN 81 MG PO CHEW
81.0000 mg | CHEWABLE_TABLET | Freq: Every day | ORAL | Status: DC
  Administered 2021-05-21 – 2021-05-24 (×4): 81 mg via ORAL
  Filled 2021-05-20 (×4): qty 1

## 2021-05-20 MED ORDER — POLYETHYLENE GLYCOL 3350 17 G PO PACK: 17.0000 g | PACK | Freq: Every day | ORAL | Status: DC | PRN

## 2021-05-20 MED ORDER — SODIUM CHLORIDE 0.9 % IV SOLN: INTRAVENOUS | Status: AC

## 2021-05-20 MED ORDER — IOHEXOL 350 MG/ML SOLN
100.0000 mL | Freq: Once | INTRAVENOUS | Status: AC | PRN
  Administered 2021-05-20: 75 mL via INTRAVENOUS

## 2021-05-20 MED ORDER — ATORVASTATIN CALCIUM 10 MG PO TABS
10.0000 mg | ORAL_TABLET | Freq: Every day | ORAL | Status: DC
  Administered 2021-05-21 – 2021-05-24 (×4): 10 mg via ORAL
  Filled 2021-05-20 (×4): qty 1

## 2021-05-20 MED ORDER — ENOXAPARIN SODIUM 40 MG/0.4ML IJ SOSY
40.0000 mg | PREFILLED_SYRINGE | INTRAMUSCULAR | Status: DC
  Administered 2021-05-21 – 2021-05-22 (×2): 40 mg via SUBCUTANEOUS
  Filled 2021-05-20 (×2): qty 0.4

## 2021-05-20 MED ORDER — ACETAMINOPHEN 650 MG RE SUPP: 650.0000 mg | Freq: Four times a day (QID) | RECTAL | Status: DC | PRN

## 2021-05-20 MED ORDER — IPRATROPIUM-ALBUTEROL 0.5-2.5 (3) MG/3ML IN SOLN: 3.0000 mL | RESPIRATORY_TRACT | Status: DC | PRN

## 2021-05-20 MED ORDER — ACETAMINOPHEN 325 MG PO TABS
650.0000 mg | ORAL_TABLET | Freq: Four times a day (QID) | ORAL | Status: DC | PRN
  Administered 2021-05-21 (×2): 650 mg via ORAL
  Filled 2021-05-20 (×2): qty 2

## 2021-05-20 MED ORDER — PROMETHAZINE HCL 12.5 MG PO TABS: 12.5000 mg | ORAL_TABLET | Freq: Four times a day (QID) | ORAL | Status: DC | PRN

## 2021-05-20 MED ORDER — INSULIN ASPART 100 UNIT/ML IJ SOLN: 0.0000 [IU] | Freq: Every day | INTRAMUSCULAR | Status: DC

## 2021-05-20 MED ORDER — INSULIN ASPART 100 UNIT/ML IJ SOLN
0.0000 [IU] | Freq: Three times a day (TID) | INTRAMUSCULAR | Status: DC
Start: 2021-05-21 — End: 2021-05-21
  Administered 2021-05-21: 09:00:00 1 [IU] via SUBCUTANEOUS
  Administered 2021-05-21: 12:00:00 2 [IU] via SUBCUTANEOUS

## 2021-05-20 NOTE — ED Triage Notes (Signed)
Pt brought in by [facility] from home with c/o chest pain and SOB since this morning. Pt's RA O2 sat was 85%. 4L O2 applied by EMS with increase in O2 sat to 97%. 4 baby ASA given to pt. 20g IV right ac. 12 lead shows left bundle branch block.

## 2021-05-20 NOTE — ED Provider Notes (Signed)
[facility] Provider Note   CSN: [id number] Arrival date & time: 05/20/21  1619     History Chief Complaint  Patient presents with   Chest Pain    Annette Fitzgerald is a 87 y.o. female.  She is brought in by EMS from home.  She lives with her daughter, has memory issues, level 5 caveat.  Patient daughter giving most of the history.  She said she had given her a bath earlier and after they finish that patient clutched her chest.  Seem to be having shortness of breath.  Patient does not recall this and denies any chest pain currently.  EMS found with pulse ox of 85% and placed on oxygen.  Given 4 baby aspirin.  Patient does not have history of cardiac disease.  She has not been sick recently, no cough no vomiting no diarrhea.  She is usually ambulatory  The history is provided by the patient and a relative.  Chest Pain Pain location:  Substernal area Pain severity:  Moderate Onset quality:  Sudden Duration:  1 hour Progression:  Resolved Chronicity:  New Relieved by:  Aspirin Worsened by:  Nothing Ineffective treatments:  None tried Associated symptoms: shortness of breath   Associated symptoms: no abdominal pain, no cough, no headache and no vomiting    HPI: A 87 year old patient with a history of treated diabetes and hypertension presents for evaluation of chest pain. Initial onset of pain was approximately 1-3 hours ago. The patient's chest pain is not worse with exertion. The patient's chest pain is middle- or left-sided, is not well-localized, is not described as heaviness/pressure/tightness, is not sharp and does not radiate to the arms/jaw/neck. The patient does not complain of nausea and denies diaphoresis. The patient has no history of stroke, has no history of peripheral artery disease, has not smoked in the past 90 days, has no relevant family history of coronary artery disease (first degree relative at less than age 89), has no history of  hypercholesterolemia and does not have an elevated BMI (>=30).   Past Medical History:  Diagnosis Date   Diabetes mellitus without complication ([place])    Hypertension    Neuropathy due to type 2 diabetes mellitus [facility])     Patient Active Problem List   Diagnosis Date Noted   Essential hypertension 10/13/2019   Stage 3a chronic kidney disease ([place]) 10/13/2019   Type II or unspecified type diabetes mellitus with renal manifestations, uncontrolled 10/13/2019    Past Surgical History:  Procedure Laterality Date   CATARACT EXTRACTION       OB History     Gravida  2   Para  2   Term  2   Preterm      AB      Living         SAB      IAB      Ectopic      Multiple      Live Births              No family history on file.  Social History   Tobacco Use   Smoking status: Never   Smokeless tobacco: Never  Substance Use Topics   Alcohol use: No   Drug use: No    Home Medications Prior to Admission medications   Medication Sig Start Date End Date Taking? Authorizing Provider  ACCU-CHEK AVIVA PLUS test strip  03/07/18   [provider]  ACCU-CHEK SOFTCLIX LANCETS lancets  03/30/18  [provider]  Alcohol Swabs (B-D SINGLE USE SWABS REGULAR) PADS  03/30/18   [provider]  alendronate (FOSAMAX) 70 MG tablet Take 70 mg by mouth once a week.  03/30/18   [provider]  ALPRAZolam Duanne Moron) 1 MG tablet Take by mouth. 11/06/15   [provider]  amLODipine (NORVASC) 10 MG tablet  04/18/18   [provider]  aspirin 81 MG chewable tablet Chew by mouth. 11/06/15   [provider]  atorvastatin (LIPITOR) 10 MG tablet Take by mouth. 11/06/15   [provider]  BD PEN NEEDLE NANO 2ND GEN 32G X 4 MM MISC  01/31/20   [provider]  cholecalciferol (VITAMIN D) 25 MCG (1000 UT) tablet Take by mouth. 11/06/15   [provider]  donepezil (ARICEPT) 10 MG tablet Take by mouth. 11/06/15    [provider]  gabapentin (NEURONTIN) 300 MG capsule Take 300 mg by mouth 2 (two) times daily.  11/06/15   [provider]  glipiZIDE (GLUCOTROL XL) 10 MG 24 hr tablet Take 10 mg by mouth daily with breakfast.    [provider]  hydrALAZINE (APRESOLINE) 25 MG tablet TAKE 1 TABLET BY MOUTH TWICE DAILY 03/29/18   [provider]  Insulin Glargine (BASAGLAR KWIKPEN) 100 UNIT/ML Inject into the skin.    [provider]  iron polysaccharides (NU-IRON) 150 MG capsule Take by mouth. 11/06/15   [provider]  latanoprost (XALATAN) 0.005 % ophthalmic solution  02/28/18   [provider]  linagliptin (TRADJENTA) 5 MG TABS tablet Take 5 mg by mouth daily.    [provider]  losartan (COZAAR) 25 MG tablet Take 12.5 mg by mouth daily.    [provider]  losartan (COZAAR) 25 MG tablet Take by mouth. 11/14/20 11/14/21  [provider]  timolol (TIMOPTIC) 0.25 % ophthalmic solution PLACE 1 DROP INTO BOTH EYES BID 05/12/18   [provider]    Allergies    Patient has no known allergies.  Review of Systems   Review of Systems  Unable to perform ROS: Dementia  Respiratory:  Positive for shortness of breath. Negative for cough.   Cardiovascular:  Positive for chest pain.  Gastrointestinal:  Negative for abdominal pain and vomiting.  Neurological:  Negative for headaches.   Physical Exam Updated Vital Signs BP (!) 144/77    Pulse 96    Temp 99.5 F (37.5 C) (Oral)    Resp 20    Ht 5\' 6"  (1.676 m)    Wt 70.3 kg    SpO2 90%    BMI 25.01 kg/m   Physical Exam Vitals and nursing note reviewed.  Constitutional:      General: She is not in acute distress.    Appearance: She is well-developed.  HENT:     Head: Normocephalic and atraumatic.  Eyes:     Conjunctiva/sclera: Conjunctivae normal.  Cardiovascular:     Rate and Rhythm: Normal rate and regular rhythm.     Heart sounds: Normal heart sounds. No  murmur heard. Pulmonary:     Effort: Pulmonary effort is normal. No respiratory distress.     Breath sounds: Normal breath sounds.  Abdominal:     Palpations: Abdomen is soft. There is no mass.     Tenderness: There is no abdominal tenderness.  Musculoskeletal:        General: No swelling.     Cervical back: Neck supple.     Right lower leg: No tenderness.  No edema.     Left lower leg: No tenderness. No edema.  Skin:    General: Skin is warm and dry.     Capillary Refill: Capillary refill takes less than 2 seconds.  Neurological:     Mental Status: She is alert.  Psychiatric:        Mood and Affect: Mood normal.    ED Results / Procedures / Treatments   Labs (all labs ordered are listed, but only abnormal results are displayed) Labs Reviewed  COMPREHENSIVE METABOLIC PANEL - Abnormal; Notable for the following components:      Result Value   Glucose, Bld 170 (*)    Creatinine, Ser 1.18 (*)    Total Protein 8.6 (*)    GFR, Estimated 45 (*)    All other components within normal limits  CBC WITH DIFFERENTIAL/PLATELET - Abnormal; Notable for the following components:   WBC 12.5 (*)    Neutro Abs 11.1 (*)    Lymphs Abs 0.6 (*)    All other components within normal limits  HEMOGLOBIN A1C - Abnormal; Notable for the following components:   Hgb A1c MFr Bld 8.6 (*)    All other components within normal limits  GLUCOSE, CAPILLARY - Abnormal; Notable for the following components:   Glucose-Capillary 148 (*)    All other components within normal limits  GLUCOSE, CAPILLARY - Abnormal; Notable for the following components:   Glucose-Capillary 136 (*)    All other components within normal limits  RESP PANEL BY RT-PCR (FLU A&B, COVID) ARPGX2  LIPASE, BLOOD  BRAIN NATRIURETIC PEPTIDE  TROPONIN I (HIGH SENSITIVITY)  TROPONIN I (HIGH SENSITIVITY)  TROPONIN I (HIGH SENSITIVITY)    EKG EKG Interpretation  Date/Time:  Tuesday May 20 2021 17:02:39 EST Ventricular Rate:  97 PR  Interval:  170 QRS Duration: 129 QT Interval:  370 QTC Calculation: 470 R Axis:   40 Text Interpretation: Sinus rhythm Probable left atrial enlargement Left bundle branch block increased ST changes from prior 11/06 Confirmed by Annette Fitzgerald [phone]) on 05/20/2021 5:13:22 PM  Radiology CT Angio Chest PE W/Cm &/Or Wo Cm  Result Date: 05/20/2021 CLINICAL DATA:  Chest pain, shortness of breath EXAM: CT ANGIOGRAPHY CHEST WITH CONTRAST TECHNIQUE: Multidetector CT imaging of the chest was performed using the standard protocol during bolus administration of intravenous contrast. Multiplanar CT image reconstructions and MIPs were obtained to evaluate the vascular anatomy. CONTRAST:  10mL OMNIPAQUE IOHEXOL 350 MG/ML SOLN COMPARISON:  None. FINDINGS: Cardiovascular: Cardiomegaly. Diffuse coronary artery calcifications and aortic calcifications. No aneurysm. No filling defects in the pulmonary arteries to suggest pulmonary emboli. Mediastinum/Nodes: No mediastinal, hilar, or axillary adenopathy. Trachea and esophagus are unremarkable. Thyroid unremarkable. Lungs/Pleura: Moderate emphysema. Bibasilar atelectasis. No confluent opacities or effusions. Upper Abdomen: Imaging into the upper abdomen demonstrates no acute findings. Musculoskeletal: Chest wall soft tissues are unremarkable. No acute bony abnormality. Review of the MIP images confirms the above findings. IMPRESSION: No evidence of pulmonary embolus. Cardiomegaly, coronary artery disease. Aortic Atherosclerosis (ICD10-I70.0) and Emphysema (ICD10-J43.9). Electronically Signed   By: Rolm Baptise M.D.   On: 05/20/2021 19:26   DG Chest Port 1 View  Result Date: 05/20/2021 CLINICAL DATA:  Chest pain and shortness of breath since this morning, hypoxia EXAM: PORTABLE CHEST 1 VIEW COMPARISON:  05/05/2005 FINDINGS: Single frontal view of the chest demonstrates an enlarged cardiac silhouette. Atherosclerosis of the aortic arch. Lung volumes are diminished, with  central vascular congestion. No acute airspace disease, effusion, or pneumothorax. IMPRESSION: 1. Low lung  volumes. 2. Central vascular congestion without overt edema. Electronically Signed   By: Sharlet Salina M.D.   On: 05/20/2021 17:15    Procedures Procedures   Medications Ordered in ED Medications  insulin aspart (novoLOG) injection 0-9 Units (1 Units Subcutaneous Given 05/21/21 0846)  insulin aspart (novoLOG) injection 0-5 Units (0 Units Subcutaneous Patient Refused/Not Given 05/20/21 2350)  enoxaparin (LOVENOX) injection 40 mg (40 mg Subcutaneous Given 05/21/21 0847)  0.9 %  sodium chloride infusion ( Intravenous New Bag/Given 05/21/21 0230)  acetaminophen (TYLENOL) tablet 650 mg (650 mg Oral Given 05/21/21 0233)    Or  acetaminophen (TYLENOL) suppository 650 mg ( Rectal See Alternative 05/21/21 0233)  promethazine (PHENERGAN) tablet 12.5 mg (has no administration in time range)  polyethylene glycol (MIRALAX / GLYCOLAX) packet 17 g (has no administration in time range)  ipratropium-albuterol (DUONEB) 0.5-2.5 (3) MG/3ML nebulizer solution 3 mL (has no administration in time range)  aspirin chewable tablet 81 mg (81 mg Oral Given 05/21/21 0845)  atorvastatin (LIPITOR) tablet 10 mg (10 mg Oral Given 05/21/21 0846)  iohexol (OMNIPAQUE) 350 MG/ML injection 100 mL (75 mLs Intravenous Contrast Given 05/20/21 1922)    ED Course  I have reviewed the triage vital signs and the nursing notes.  Pertinent labs & imaging results that were available during my care of the patient were reviewed by me and considered in my medical decision making (see chart for details).  Clinical Course as of 05/21/21 1047  Tue May 20, 2021  1728 Chest x-ray showing some vascular congestion.  Awaiting radiology reading. [MB]  1945 I updated the daughter regarding her work-up so far.  She remains hypoxic requiring 4 L.  She looks very comfortable though.  CT angio chest does not show any evidence of PE.  Her  cardiac work-up is otherwise been unremarkable. [MB]  1946 Discussed with Dr. Mariea Clonts [facility] hospital[facility] who will evaluate the patient for admission. [MB]  1959 Who will take him down to see the patient.  We both together turned the patient off of oxygen and her sats seem to be holding in the mid 90s.  We will continue to observe and see if needs admission or not. [MB]  2110 Ambulation trial.  Patient desatted to 78%.  Placed back on oxygen.  Dr. Mariea Clonts to see patient for admission. [MB]    Clinical Course User Index [MB] Terrilee Files, MD   MDM Rules/Calculators/A&P HEAR Score: 4                        This patient complains of chest pain shortness of breath hypoxia; this involves an extensive number of treatment Options and is a complaint that carries with it a high risk of complications and Morbidity. The differential includes ACS, pneumonia, pneumothorax, PE, vascular, musculoskeletal, pneumonia, reflux  I ordered, reviewed and interpreted labs, which included CBC with mildly elevated white count normal hemoglobin, chemistries fairly normal other than elevated glucose and creatinine, COVID and flu negative, troponins flat, BNP not elevated I ordered medication oxygen I ordered imaging studies which included chest x-ray CT PE and I independently    visualized and interpreted imaging which showed no infiltrate no evidence of PE Additional history obtained from patient's daughters and granddaughter Previous records obtained and reviewed in epic no recent admissions I consulted Dr. Mariea Clonts [facility] hospitalist and discussed lab and imaging findings  Critical Interventions: None  After the interventions stated above, I reevaluated the patient and found patient be  resting and very asymptomatic.  Trending pulse ox shows her to desat so will need admission for hypoxia and further work-up.  Patient and family agreeable to plan   Final Clinical Impression(s) / ED Diagnoses Final diagnoses:   Nonspecific chest pain  Hypoxia    Rx / DC Orders ED Discharge Orders     None        Hayden Rasmussen, MD 05/21/21 1050

## 2021-05-20 NOTE — ED Notes (Signed)
Pt ambulated with and O2 dropped down to 79%. Pt did not complain of feeling SOB. Pt placed back in bed and put back on 2L [place]. Sats trending back up.

## 2021-05-20 NOTE — H&P (Signed)
History and Physical    Annette PertMildred A Epperly [id number] DOB: 1933-02-13 DOA: 05/20/2021  PCP: Avon GullyFanta, [place], Annette Fitzgerald   Patient coming from: Home  I have personally briefly reviewed patient's old medical records [facility]  Chief Complaint: Chest pain, difficulty breathing  HPI: Annette Fitzgerald is a 87 y.o. female with medical history significant for hypertension, diabetes mellitus. Patient was brought to the ED by ED EMS reports of sudden chest pain and difficulty breathing.  On my evaluation Daughter Annette Fitzgerald is at bedside, and helps with history.  Patient does not remember details of earlier today.  Patient's other daughter Annette Fitzgerald had just finished giving patient a bath, when suddenly patient cluctched her chest and said she was having difficulty breathing. At the time of my evaluation, patient is calm, not in any distress whatsoever.  She denies any current chest pain or difficulty breathing and does not remember having any of these symptoms earlier. Family at bedside reports patient has been doing well.  No fevers no chills no cough.  No complaints of pain whatsoever.   EMS reports that patient's O2 sats were 85% on room air, she was placed on 4 L.  ED Course: Temperature 99.5.  Pulse rate 80s to 106.  Respiratory rate 16-26.  Blood pressure systolic 112 -144.  On my evaluation, O2 sats 95 - 100% on room air, at rest.  WBC 12.2.  Troponin 5 x2.  BNP 88.  Chest x-ray central vascular congestion without overt pulmonary edema.  CTA-no evidence of PE.  Cardiomegaly coronary artery disease, agrees emphysema, bibasilar atelectasis.  EKG shows old left bundle branch block. Initial plan was to discharge patient home from the ED, but O2 sats dropped to 79% with ambulation.  Patient was placed back on 2 L.  Review of Systems: As per HPI all other systems reviewed and negative.  Past Medical History:  Diagnosis Date   Diabetes mellitus without complication (HCC)    Hypertension    Neuropathy  due to type 2 diabetes mellitus (HCC)     Past Surgical History:  Procedure Laterality Date   CATARACT EXTRACTION       reports that she has never smoked. She has never used smokeless tobacco. She reports that she does not drink alcohol and does not use drugs.  No Known Allergies  Family history of hypertension.  Prior to Admission medications   Medication Sig Start Date End Date Taking? Authorizing Provider  ACCU-CHEK AVIVA PLUS test strip  03/07/18   Provider, Historical, Annette Fitzgerald  ACCU-CHEK SOFTCLIX LANCETS lancets  03/30/18   Provider, Historical, Annette Fitzgerald  Alcohol Swabs (B-D SINGLE USE SWABS REGULAR) PADS  03/30/18   Provider, Historical, Annette Fitzgerald  alendronate (FOSAMAX) 70 MG tablet Take 70 mg by mouth once a week.  03/30/18   Provider, Historical, Annette Fitzgerald  ALPRAZolam Prudy Feeler(XANAX) 1 MG tablet Take by mouth. 11/06/15   Provider, Historical, Annette Fitzgerald  amLODipine (NORVASC) 10 MG tablet  04/18/18   Provider, Historical, Annette Fitzgerald  aspirin 81 MG chewable tablet Chew by mouth. 11/06/15   Provider, Historical, Annette Fitzgerald  atorvastatin (LIPITOR) 10 MG tablet Take by mouth. 11/06/15   Provider, Historical, Annette Fitzgerald  BD PEN NEEDLE NANO 2ND GEN 32G X 4 MM MISC  01/31/20   Provider, Historical, Annette Fitzgerald  cholecalciferol (VITAMIN D) 25 MCG (1000 UT) tablet Take by mouth. 11/06/15   Provider, Historical, Annette Fitzgerald  donepezil (ARICEPT) 10 MG tablet Take by mouth. 11/06/15   Provider, Historical, Annette Fitzgerald  gabapentin (NEURONTIN) 300 MG capsule Take 300  mg by mouth 2 (two) times daily.  11/06/15   Provider, Historical, Annette Fitzgerald  glipiZIDE (GLUCOTROL XL) 10 MG 24 hr tablet Take 10 mg by mouth daily with breakfast.    Provider, Historical, Annette Fitzgerald  hydrALAZINE (APRESOLINE) 25 MG tablet TAKE 1 TABLET BY MOUTH TWICE DAILY 03/29/18   Provider, Historical, Annette Fitzgerald  Insulin Glargine (BASAGLAR KWIKPEN) 100 UNIT/ML Inject into the skin.    Provider, Historical, Annette Fitzgerald  iron polysaccharides (NU-IRON) 150 MG capsule Take by mouth. 11/06/15   Provider, Historical, Annette Fitzgerald  latanoprost (XALATAN) 0.005 % ophthalmic  solution  02/28/18   Provider, Historical, Annette Fitzgerald  linagliptin (TRADJENTA) 5 MG TABS tablet Take 5 mg by mouth daily.    Provider, Historical, Annette Fitzgerald  losartan (COZAAR) 25 MG tablet Take 12.5 mg by mouth daily.    Provider, Historical, Annette Fitzgerald  losartan (COZAAR) 25 MG tablet Take by mouth. 11/14/20 11/14/21  Provider, Historical, Annette Fitzgerald  timolol (TIMOPTIC) 0.25 % ophthalmic solution PLACE 1 DROP INTO BOTH EYES BID 05/12/18   Provider, Historical, Annette Fitzgerald    Physical Exam: Vitals:   05/20/21 1956 05/20/21 2000 05/20/21 2030 05/20/21 2100  BP:  125/64 112/67 120/70  Pulse: 91 88 87 93  Resp: 20 (!) 22 (!) 21 (!) 21  Temp:      TempSrc:      SpO2: 96% 96% 95% 95%  Weight:      Height:        Constitutional: NAD, calm, comfortable Vitals:   05/20/21 1956 05/20/21 2000 05/20/21 2030 05/20/21 2100  BP:  125/64 112/67 120/70  Pulse: 91 88 87 93  Resp: 20 (!) 22 (!) 21 (!) 21  Temp:      TempSrc:      SpO2: 96% 96% 95% 95%  Weight:      Height:       Eyes: PERRL, lids and conjunctivae normal ENMT: Mucous membranes are dry  Neck: normal, supple, no masses, no thyromegaly Respiratory: clear to auscultation bilaterally, no wheezing, no crackles. Normal respiratory effort. No accessory muscle use.  Cardiovascular: Regular rate and rhythm, no murmurs / rubs / gallops. No extremity edema.  Lower extremities warm and well-perfused. Abdomen: no tenderness, no masses palpated. No hepatosplenomegaly. Bowel sounds positive.  Musculoskeletal: no clubbing / cyanosis. No joint deformity upper and lower extremities.  Skin: no rashes, lesions, ulcers. No induration Neurologic: No apparent cranial nerve abnormality.  4+5 strength in all extremities. Psychiatric: Awake and alert oriented to person only.  Thinks she is in [place] and at home.  Not oriented to situation.  Labs on Admission: I have personally reviewed following labs and imaging studies  CBC: Recent Labs  Lab 05/20/21 1713  WBC 12.5*  NEUTROABS 11.1*   HGB 12.5  HCT 39.1  MCV 96.5  PLT 272   Basic Metabolic Panel: Recent Labs  Lab 05/20/21 1713  NA 135  K 4.2  CL 101  CO2 24  GLUCOSE 170*  BUN 21  CREATININE 1.18*  CALCIUM 9.2   Liver Function Tests: Recent Labs  Lab 05/20/21 1713  AST 21  ALT 16  ALKPHOS 95  BILITOT 0.9  PROT 8.6*  ALBUMIN 3.9   Recent Labs  Lab 05/20/21 1713  LIPASE 43   Radiological Exams on Admission: CT Angio Chest PE W/Cm &/Or Wo Cm  Result Date: 05/20/2021 CLINICAL DATA:  Chest pain, shortness of breath EXAM: CT ANGIOGRAPHY CHEST WITH CONTRAST TECHNIQUE: Multidetector CT imaging of the chest was performed using the standard protocol during bolus administration of  intravenous contrast. Multiplanar CT image reconstructions and MIPs were obtained to evaluate the vascular anatomy. CONTRAST:  74mL OMNIPAQUE IOHEXOL 350 MG/ML SOLN COMPARISON:  None. FINDINGS: Cardiovascular: Cardiomegaly. Diffuse coronary artery calcifications and aortic calcifications. No aneurysm. No filling defects in the pulmonary arteries to suggest pulmonary emboli. Mediastinum/Nodes: No mediastinal, hilar, or axillary adenopathy. Trachea and esophagus are unremarkable. Thyroid unremarkable. Lungs/Pleura: Moderate emphysema. Bibasilar atelectasis. No confluent opacities or effusions. Upper Abdomen: Imaging into the upper abdomen demonstrates no acute findings. Musculoskeletal: Chest wall soft tissues are unremarkable. No acute bony abnormality. Review of the MIP images confirms the above findings. IMPRESSION: No evidence of pulmonary embolus. Cardiomegaly, coronary artery disease. Aortic Atherosclerosis (ICD10-I70.0) and Emphysema (ICD10-J43.9). Electronically Signed   By: Rolm Baptise M.D.   On: 05/20/2021 19:26   DG Chest Port 1 View  Result Date: 05/20/2021 CLINICAL DATA:  Chest pain and shortness of breath since this morning, hypoxia EXAM: PORTABLE CHEST 1 VIEW COMPARISON:  05/05/2005 FINDINGS: Single frontal view of the  chest demonstrates an enlarged cardiac silhouette. Atherosclerosis of the aortic arch. Lung volumes are diminished, with central vascular congestion. No acute airspace disease, effusion, or pneumothorax. IMPRESSION: 1. Low lung volumes. 2. Central vascular congestion without overt edema. Electronically Signed   By: Randa Ngo M.D.   On: 05/20/2021 17:15    EKG: Independently reviewed.  Sinus rhythm rate 97.  Left bundle branch block old.  Patient has a second EKG on file from 2006 viewable on the [place].  Unfortunately I am unable to open MUSE. EDP printed it.    Old EKG shows left bundle branch block from 05/05/2005 ( see attached image).     Assessment/Plan Principal Problem:   Acute respiratory failure with hypoxia (HCC) Active Problems:   Essential hypertension   Chest pain   DM (diabetes mellitus) ([place])   Dementia (HCC)   Acute hypoxic respiratory failure-EMS reports O2 sats were 85% at the scene, O2 sats here on room air at rest 96 -100%, but with ambulation O2 sats dropped to 79%.  She is currently on 2 L.  She is calm and comfortable, no evidence of dyspnea.  CTA chest shows moderate emphysema, bibasilar atelectasis, no PE.  Lung exam unremarkable. ? chronic lung disease. -May need supplemental oxygen on discharge -Follow-up echo, check pulmonary artery pressures - N/s 75cc/hr x 12hrs  Chest pain-troponins unremarkable.  EKG shows old left bundle branch block.  No cardiac history.  CT suggest cardiomegaly, coronary artery disease. -Trend troponin -EKG in the morning - Obtain echo -Resume Lipitor, aspirin  Hypertension-stable. -Hold losartan with contrast exposure. -Resume hydralazine, Norvasc  Diabetes mellitus-random glucose 170. - SSI- S -med rec pending  -Resume gabapentin  Dementia-mostly with short-term memory problems.  Needs assistance with some ADLs. -Resume home Xanax,  DVT prophylaxis: Lovenox Code Status: Full code, confirmed with daughter Annette Fitzgerald at  bedside. Family Communication: Daughter Annette Fitzgerald is patient's healthcare power of attorney Disposition Plan:  ~ 1 -2 days Consults called: None Admission status: Obs tele   Bethena Roys Annette Fitzgerald [facility]  05/20/2021, 9:55 PM

## 2021-05-21 ENCOUNTER — Observation Stay (HOSPITAL_COMMUNITY): Payer: Medicare HMO

## 2021-05-21 DIAGNOSIS — Z79899 Other long term (current) drug therapy: Secondary | ICD-10-CM | POA: Diagnosis not present

## 2021-05-21 DIAGNOSIS — I4891 Unspecified atrial fibrillation: Secondary | ICD-10-CM | POA: Diagnosis present

## 2021-05-21 DIAGNOSIS — E1122 Type 2 diabetes mellitus with diabetic chronic kidney disease: Secondary | ICD-10-CM | POA: Diagnosis present

## 2021-05-21 DIAGNOSIS — R079 Chest pain, unspecified: Secondary | ICD-10-CM

## 2021-05-21 DIAGNOSIS — Z7984 Long term (current) use of oral hypoglycemic drugs: Secondary | ICD-10-CM | POA: Diagnosis not present

## 2021-05-21 DIAGNOSIS — J9601 Acute respiratory failure with hypoxia: Secondary | ICD-10-CM | POA: Diagnosis present

## 2021-05-21 DIAGNOSIS — Z7983 Long term (current) use of bisphosphonates: Secondary | ICD-10-CM | POA: Diagnosis not present

## 2021-05-21 DIAGNOSIS — E1140 Type 2 diabetes mellitus with diabetic neuropathy, unspecified: Secondary | ICD-10-CM | POA: Diagnosis present

## 2021-05-21 DIAGNOSIS — N1831 Chronic kidney disease, stage 3a: Secondary | ICD-10-CM | POA: Diagnosis present

## 2021-05-21 DIAGNOSIS — I495 Sick sinus syndrome: Secondary | ICD-10-CM | POA: Diagnosis present

## 2021-05-21 DIAGNOSIS — Z20822 Contact with and (suspected) exposure to covid-19: Secondary | ICD-10-CM | POA: Diagnosis present

## 2021-05-21 DIAGNOSIS — Z7982 Long term (current) use of aspirin: Secondary | ICD-10-CM | POA: Diagnosis not present

## 2021-05-21 DIAGNOSIS — T380X5A Adverse effect of glucocorticoids and synthetic analogues, initial encounter: Secondary | ICD-10-CM | POA: Diagnosis not present

## 2021-05-21 DIAGNOSIS — J439 Emphysema, unspecified: Secondary | ICD-10-CM | POA: Diagnosis present

## 2021-05-21 DIAGNOSIS — Z794 Long term (current) use of insulin: Secondary | ICD-10-CM | POA: Diagnosis not present

## 2021-05-21 DIAGNOSIS — I130 Hypertensive heart and chronic kidney disease with heart failure and stage 1 through stage 4 chronic kidney disease, or unspecified chronic kidney disease: Secondary | ICD-10-CM | POA: Diagnosis present

## 2021-05-21 DIAGNOSIS — I480 Paroxysmal atrial fibrillation: Secondary | ICD-10-CM | POA: Diagnosis present

## 2021-05-21 DIAGNOSIS — D72829 Elevated white blood cell count, unspecified: Secondary | ICD-10-CM | POA: Diagnosis not present

## 2021-05-21 DIAGNOSIS — E1165 Type 2 diabetes mellitus with hyperglycemia: Secondary | ICD-10-CM | POA: Diagnosis not present

## 2021-05-21 DIAGNOSIS — Z8249 Family history of ischemic heart disease and other diseases of the circulatory system: Secondary | ICD-10-CM | POA: Diagnosis not present

## 2021-05-21 DIAGNOSIS — I5033 Acute on chronic diastolic (congestive) heart failure: Secondary | ICD-10-CM | POA: Diagnosis present

## 2021-05-21 DIAGNOSIS — J9811 Atelectasis: Secondary | ICD-10-CM | POA: Diagnosis present

## 2021-05-21 DIAGNOSIS — F0390 Unspecified dementia without behavioral disturbance: Secondary | ICD-10-CM | POA: Diagnosis present

## 2021-05-21 DIAGNOSIS — I447 Left bundle-branch block, unspecified: Secondary | ICD-10-CM | POA: Diagnosis present

## 2021-05-21 DIAGNOSIS — N179 Acute kidney failure, unspecified: Secondary | ICD-10-CM | POA: Diagnosis not present

## 2021-05-21 LAB — ECHOCARDIOGRAM COMPLETE
AR max vel: 2.67 cm2
AV Area VTI: 2.13 cm2
AV Area mean vel: 2.27 cm2
AV Mean grad: 5 mmHg
AV Peak grad: 8.8 mmHg
Ao pk vel: 1.48 m/s
Area-P 1/2: 2.48 cm2
Calc EF: 52.5 %
Height: 66 in
MV VTI: 2.4 cm2
S' Lateral: 2.58 cm
Single Plane A2C EF: 52.4 %
Single Plane A4C EF: 52.3 %
Weight: 2479.73 oz

## 2021-05-21 LAB — GLUCOSE, CAPILLARY
Glucose-Capillary: 136 mg/dL — ABNORMAL HIGH (ref 70–99)
Glucose-Capillary: 173 mg/dL — ABNORMAL HIGH (ref 70–99)
Glucose-Capillary: 154 mg/dL — ABNORMAL HIGH (ref 70–99)
Glucose-Capillary: 209 mg/dL — ABNORMAL HIGH (ref 70–99)

## 2021-05-21 LAB — HEMOGLOBIN A1C
Hgb A1c MFr Bld: 8.6 % — ABNORMAL HIGH (ref 4.8–5.6)
Mean Plasma Glucose: 200.12 mg/dL

## 2021-05-21 LAB — TSH: TSH: 0.358 u[IU]/mL (ref 0.350–4.500)

## 2021-05-21 MED ORDER — ALPRAZOLAM 0.5 MG PO TABS
0.5000 mg | ORAL_TABLET | Freq: Two times a day (BID) | ORAL | Status: DC | PRN
  Administered 2021-05-21 – 2021-05-24 (×3): 0.5 mg via ORAL
  Filled 2021-05-21 (×3): qty 1

## 2021-05-21 MED ORDER — AMIODARONE HCL IN DEXTROSE 360-4.14 MG/200ML-% IV SOLN
30.0000 mg/h | INTRAVENOUS | Status: DC
  Administered 2021-05-22: 30 mg/h via INTRAVENOUS

## 2021-05-21 MED ORDER — METHYLPREDNISOLONE SODIUM SUCC 40 MG IJ SOLR
40.0000 mg | Freq: Two times a day (BID) | INTRAMUSCULAR | Status: AC
  Administered 2021-05-21 – 2021-05-22 (×3): 40 mg via INTRAVENOUS
  Filled 2021-05-21 (×3): qty 1

## 2021-05-21 MED ORDER — TIMOLOL MALEATE 0.25 % OP SOLN
1.0000 [drp] | Freq: Every day | OPHTHALMIC | Status: DC
  Administered 2021-05-22 – 2021-05-24 (×3): 1 [drp] via OPHTHALMIC
  Filled 2021-05-21: qty 5

## 2021-05-21 MED ORDER — ORAL CARE MOUTH RINSE
15.0000 mL | Freq: Two times a day (BID) | OROMUCOSAL | Status: DC
  Administered 2021-05-21 – 2021-05-24 (×6): 15 mL via OROMUCOSAL

## 2021-05-21 MED ORDER — METOPROLOL TARTRATE 25 MG PO TABS
12.5000 mg | ORAL_TABLET | Freq: Two times a day (BID) | ORAL | Status: DC
  Filled 2021-05-21: qty 1

## 2021-05-21 MED ORDER — CHLORHEXIDINE GLUCONATE CLOTH 2 % EX PADS
6.0000 | MEDICATED_PAD | Freq: Every day | CUTANEOUS | Status: DC
  Administered 2021-05-21 – 2021-05-23 (×3): 6 via TOPICAL

## 2021-05-21 MED ORDER — DILTIAZEM HCL 25 MG/5ML IV SOLN
10.0000 mg | Freq: Once | INTRAVENOUS | Status: AC
  Administered 2021-05-21: 13:00:00 10 mg via INTRAVENOUS
  Filled 2021-05-21: qty 5

## 2021-05-21 MED ORDER — OXYCODONE HCL 5 MG PO TABS: 5.0000 mg | ORAL_TABLET | Freq: Four times a day (QID) | ORAL | Status: DC | PRN

## 2021-05-21 MED ORDER — GABAPENTIN 300 MG PO CAPS
300.0000 mg | ORAL_CAPSULE | Freq: Two times a day (BID) | ORAL | Status: DC
  Administered 2021-05-21 – 2021-05-24 (×6): 300 mg via ORAL
  Filled 2021-05-21 (×6): qty 1

## 2021-05-21 MED ORDER — DONEPEZIL HCL 5 MG PO TABS
10.0000 mg | ORAL_TABLET | Freq: Every day | ORAL | Status: DC
  Administered 2021-05-21 – 2021-05-23 (×3): 10 mg via ORAL
  Filled 2021-05-21 (×3): qty 2

## 2021-05-21 MED ORDER — INSULIN ASPART 100 UNIT/ML IJ SOLN
0.0000 [IU] | Freq: Three times a day (TID) | INTRAMUSCULAR | Status: DC
  Administered 2021-05-22: 11 [IU] via SUBCUTANEOUS
  Administered 2021-05-22: 8 [IU] via SUBCUTANEOUS

## 2021-05-21 MED ORDER — GLIPIZIDE ER 5 MG PO TB24
10.0000 mg | ORAL_TABLET | Freq: Every day | ORAL | Status: DC
  Administered 2021-05-22 – 2021-05-24 (×3): 10 mg via ORAL
  Filled 2021-05-21 (×3): qty 2

## 2021-05-21 MED ORDER — INSULIN ASPART 100 UNIT/ML IJ SOLN
0.0000 [IU] | Freq: Every day | INTRAMUSCULAR | Status: DC
  Administered 2021-05-21: 22:00:00 2 [IU] via SUBCUTANEOUS

## 2021-05-21 MED ORDER — DILTIAZEM HCL-DEXTROSE 125-5 MG/125ML-% IV SOLN (PREMIX)
5.0000 mg/h | INTRAVENOUS | Status: DC
  Administered 2021-05-21: 16:00:00 5 mg/h via INTRAVENOUS
  Filled 2021-05-21: qty 125

## 2021-05-21 MED ORDER — ADENOSINE 6 MG/2ML IV SOLN
INTRAVENOUS | Status: AC
  Administered 2021-05-21: 17:00:00 6 mg
  Filled 2021-05-21: qty 2

## 2021-05-21 MED ORDER — LATANOPROST 0.005 % OP SOLN
1.0000 [drp] | Freq: Every day | OPHTHALMIC | Status: DC
  Administered 2021-05-21 – 2021-05-23 (×3): 1 [drp] via OPHTHALMIC
  Filled 2021-05-21 (×2): qty 2.5

## 2021-05-21 MED ORDER — AMIODARONE HCL IN DEXTROSE 360-4.14 MG/200ML-% IV SOLN
60.0000 mg/h | INTRAVENOUS | Status: AC
  Administered 2021-05-21: 22:00:00 60 mg/h via INTRAVENOUS
  Filled 2021-05-21: qty 400
  Filled 2021-05-21: qty 200

## 2021-05-21 MED ORDER — ALBUTEROL SULFATE (2.5 MG/3ML) 0.083% IN NEBU: 2.5000 mg | INHALATION_SOLUTION | Freq: Four times a day (QID) | RESPIRATORY_TRACT | Status: DC | PRN

## 2021-05-21 NOTE — Progress Notes (Signed)
PROGRESS NOTE     Annette Fitzgerald, is a 87 y.o. female, DOB - [birth year], TC:[id number]  Admit date - 05/20/2021   Admitting Physician Imonie Tuch Denton Brick, MD  Outpatient Primary MD for the patient is Rosita Fire, MD  LOS - 0  Chief Complaint  Patient presents with   Chest Pain      Brief Narrative:  87 y.o. female with medical history significant for hypertension, diabetes mellitus admitted on 05/20/2021 with dyspnea and acute hypoxic respiratory failure with O2 sats of 85% on room air O2 sats improved to 95% on 4 L of oxygen via nasal cannula -On 05/21/2021 patient went to A. fib with RVR with heart rate above 150 received IV Cardizem bolus remained tachycardic transferred to stepdown unit for continuous IV Cardizem drip  Assessment & Plan:   Principal Problem:   Acute respiratory failure with hypoxia (HCC) Active Problems:   Essential hypertension   Nonspecific chest pain   DM (diabetes mellitus) ([place])   Dementia (HCC)   New onset a-fib ([place])   1)New onset Afib with RVR- --A. fib with RVR with heart rate above 150 received IV Cardizem bolus remained tachycardic transferred to stepdown unit for continuous IV Cardizem drip TSH 0.358 -Echo with EF of 50 to [id number], grade 1 diastolic dysfunction noted -No aortic stenosis or mitral stenosis, no atrial enlargement  2) acute hypoxic respiratory failure--O2 sats of 85% on room air O2 sats improved to 95% on 4 L of oxygen via nasal cannula -Attempted to wean down oxygen, patient became hypoxic with oxygen saturation of 86% on room air, placed back on 2 L of oxygen at this time -Currently  93 to 95% on 2 L -Suspect hypoxia is due to due to CHF and A. fib with RVR  3)COPD/ emphysema--- CTA chest findings noted  -patient and family deny significant tobacco use or exposure -No wheezing --avoid bronchodilators given A. fib with RVR -Low-dose steroids as ordered  4)Chest  pains --- resolved, troponin 5>>5>>6 -EKG with persistent left  bundle branch block   5)DM2-continue glipizide ,use Novolog/Humalog Sliding scale insulin with Accu-Cheks/Fingersticks as ordered   6)HTN--BP is somewhat soft, hold hydralazine and amlodipine due to soft BP, hold losartan due to soft BP and recent contrast exposure  7)Dementia --- requires help with ADLs, -Continue Aricept and as needed Xanax   Disposition/Need for in-Hospital Stay- patient unable to be discharged at this time due to --persistent hypoxia and A. fib with RVR requiring IV Cardizem drip and supplemental oxygen -Transfer to stepdown unit for Cardizem infusion -Possible discharge in 48 hours or so they have cardiorespiratory status improves  Status is: Inpatient  Remains inpatient appropriate because: Please see disposition above  Disposition: The patient is from: Home              Anticipated d/c is to: Home              Anticipated d/c date is: 2 days              Patient currently is not medically stable to d/c. Barriers: Not Clinically Stable-   Code Status :  -  Code Status: Full Code   Family Communication:   Discussed with daughter at bedside Consults  :  na  DVT Prophylaxis  :   - SCDs  enoxaparin (LOVENOX) injection 40 mg Start: 05/21/21 1000    Lab Results  Component Value Date   PLT 272 05/20/2021    Inpatient Medications  Scheduled Meds:  aspirin  81 mg Oral Daily   atorvastatin  10 mg Oral Daily   donepezil  10 mg Oral QHS   enoxaparin (LOVENOX) injection  40 mg Subcutaneous Q24H   gabapentin  300 mg Oral BID   [START ON 05/22/2021] glipiZIDE  10 mg Oral Q breakfast   insulin aspart  0-5 Units Subcutaneous QHS   insulin aspart  0-9 Units Subcutaneous TID WC   latanoprost  1 drop Both Eyes QHS   metoprolol tartrate  12.5 mg Oral BID   timolol  1 drop Both Eyes Daily   Continuous Infusions:  diltiazem (CARDIZEM) infusion     PRN Meds:.acetaminophen **OR** acetaminophen, ipratropium-albuterol, polyethylene glycol,  promethazine   Anti-infectives (From admission, onward)    None         Subjective: Annette Fitzgerald today has no fevers, no emesis,   additional history obtained from daughter at bedside -Unable to wean off O2 due to persistent hypoxia, noted to be in A. fib with RVR no further chest pains  Objective: Vitals:   05/21/21 0134 05/21/21 0501 05/21/21 1017 05/21/21 1450  BP: (!) 113/55 (!) 103/44 (!) 122/59 110/69  Pulse: 83 67 72 75  Resp: 20 18 16 18   Temp: 98.2 F (36.8 C) 99.8 F (37.7 C) 99 F (37.2 C) 99.7 F (37.6 C)  TempSrc:   Oral Oral  SpO2: 97% 98% 97% 97%  Weight:      Height:        Intake/Output Summary (Last 24 hours) at 05/21/2021 1611 Last data filed at 05/21/2021 1522 Gross per 24 hour  Intake 1288.48 ml  Output 100 ml  Net 1188.48 ml   Filed Weights   05/20/21 1632  Weight: 70.3 kg     Physical Exam  Gen:- Awake Alert, no acute distress HEENT:- [place].AT, No sclera icterus Neck-Supple Neck,No JVD,.  Lungs-  CTAB , fair symmetrical air movement CV- S1, S2 normal, irregularly irregular and tachycardic  abd-  +ve B.Sounds, Abd Soft, No tenderness,    Extremity/Skin:- No  edema, pedal pulses present  Psych-affect is appropriate, baseline/underlying cognitive and memory deficits consistent with moderate dementia  neuro-no new focal deficits, no tremors  Data Reviewed: I have personally reviewed following labs and imaging studies  CBC: Recent Labs  Lab 05/20/21 1713  WBC 12.5*  NEUTROABS 11.1*  HGB 12.5  HCT 39.1  MCV 96.5  PLT 272   Basic Metabolic Panel: Recent Labs  Lab 05/20/21 1713  NA 135  K 4.2  CL 101  CO2 24  GLUCOSE 170*  BUN 21  CREATININE 1.18*  CALCIUM 9.2   GFR: Estimated Creatinine Clearance: 32 mL/min (A) (by C-G formula based on SCr of 1.18 mg/dL (H)). Liver Function Tests: Recent Labs  Lab 05/20/21 1713  AST 21  ALT 16  ALKPHOS 95  BILITOT 0.9  PROT 8.6*  ALBUMIN 3.9   Recent Labs  Lab  05/20/21 1713  LIPASE 43   No results for input(s): AMMONIA in the last 168 hours. Coagulation Profile: No results for input(s): INR, PROTIME in the last 168 hours. Cardiac Enzymes: No results for input(s): CKTOTAL, CKMB, CKMBINDEX, TROPONINI in the last 168 hours. BNP (last 3 results) No results for input(s): PROBNP in the last 8760 hours. HbA1C: Recent Labs    05/20/21 1713  HGBA1C 8.6*   CBG: Recent Labs  Lab 05/20/21 2321 05/21/21 0737 05/21/21 1124  GLUCAP 148* 136* 173*   Lipid Profile: No results for input(s): CHOL, HDL, LDLCALC, TRIG, CHOLHDL, LDLDIRECT  in the last 72 hours. Thyroid Function Tests: Recent Labs    05/20/21 1713  TSH 0.358   Anemia Panel: No results for input(s): VITAMINB12, FOLATE, FERRITIN, TIBC, IRON, RETICCTPCT in the last 72 hours. Urine analysis: No results found for: COLORURINE, APPEARANCEUR, LABSPEC, PHURINE, GLUCOSEU, HGBUR, BILIRUBINUR, KETONESUR, PROTEINUR, UROBILINOGEN, NITRITE, LEUKOCYTESUR Sepsis Labs: @LABRCNTIP (procalcitonin:4,lacticidven:4)  ) Recent Results (from the past 240 hour(s))  Resp Panel by RT-PCR (Flu A&B, Covid) Nasopharyngeal Swab     Status: None   Collection Time: 05/20/21  5:13 PM   Specimen: Nasopharyngeal Swab; Nasopharyngeal(NP) swabs in vial transport medium  Result Value Ref Range Status   SARS Coronavirus 2 by RT PCR NEGATIVE NEGATIVE Final    Comment: (NOTE) SARS-CoV-2 target nucleic acids are NOT DETECTED.  The SARS-CoV-2 RNA is generally detectable in upper respiratory specimens during the acute phase of infection. The lowest concentration of SARS-CoV-2 viral copies this assay can detect is 138 copies/mL. A negative result does not preclude SARS-Cov-2 infection and should not be used as the sole basis for treatment or other patient management decisions. A negative result may occur with  improper specimen collection/handling, submission of specimen other than nasopharyngeal swab, presence of viral  mutation(s) within the areas targeted by this assay, and inadequate number of viral copies(<138 copies/mL). A negative result must be combined with clinical observations, patient history, and epidemiological information. The expected result is Negative.  Fact Sheet for Patients:  EntrepreneurPulse.com.au  Fact Sheet for Healthcare Providers:  IncredibleEmployment.be  This test is no t yet approved or cleared by the [place] FDA and  has been authorized for detection and/or diagnosis of SARS-CoV-2 by FDA under an Emergency Use Authorization (EUA). This EUA will remain  in effect (meaning this test can be used) for the duration of the COVID-19 declaration under Section 564(b)(1) of the Act, 21 U.S.C.section 360bbb-3(b)(1), unless the authorization is terminated  or revoked sooner.       Influenza A by PCR NEGATIVE NEGATIVE Final   Influenza B by PCR NEGATIVE NEGATIVE Final    Comment: (NOTE) The Xpert Xpress SARS-CoV-2/FLU/RSV plus assay is intended as an aid in the diagnosis of influenza from Nasopharyngeal swab specimens and should not be used as a sole basis for treatment. Nasal washings and aspirates are unacceptable for Xpert Xpress SARS-CoV-2/FLU/RSV testing.  Fact Sheet for Patients: EntrepreneurPulse.com.au  Fact Sheet for Healthcare Providers: IncredibleEmployment.be  This test is not yet approved or cleared by the [place] FDA and has been authorized for detection and/or diagnosis of SARS-CoV-2 by FDA under an Emergency Use Authorization (EUA). This EUA will remain in effect (meaning this test can be used) for the duration of the COVID-19 declaration under Section 564(b)(1) of the Act, 21 U.S.C. section 360bbb-3(b)(1), unless the authorization is terminated or revoked.  Performed at [facility], [address], [place], [place] [address]       Radiology Studies: CT Angio Chest PE  W/Cm &/Or Wo Cm  Result Date: 05/20/2021 CLINICAL DATA:  Chest pain, shortness of breath EXAM: CT ANGIOGRAPHY CHEST WITH CONTRAST TECHNIQUE: Multidetector CT imaging of the chest was performed using the standard protocol during bolus administration of intravenous contrast. Multiplanar CT image reconstructions and MIPs were obtained to evaluate the vascular anatomy. CONTRAST:  46mL OMNIPAQUE IOHEXOL 350 MG/ML SOLN COMPARISON:  None. FINDINGS: Cardiovascular: Cardiomegaly. Diffuse coronary artery calcifications and aortic calcifications. No aneurysm. No filling defects in the pulmonary arteries to suggest pulmonary emboli. Mediastinum/Nodes: No mediastinal, hilar, or axillary adenopathy. Trachea and  esophagus are unremarkable. Thyroid unremarkable. Lungs/Pleura: Moderate emphysema. Bibasilar atelectasis. No confluent opacities or effusions. Upper Abdomen: Imaging into the upper abdomen demonstrates no acute findings. Musculoskeletal: Chest wall soft tissues are unremarkable. No acute bony abnormality. Review of the MIP images confirms the above findings. IMPRESSION: No evidence of pulmonary embolus. Cardiomegaly, coronary artery disease. Aortic Atherosclerosis (ICD10-I70.0) and Emphysema (ICD10-J43.9). Electronically Signed   By: Rolm Baptise M.D.   On: 05/20/2021 19:26   DG Chest Port 1 View  Result Date: 05/20/2021 CLINICAL DATA:  Chest pain and shortness of breath since this morning, hypoxia EXAM: PORTABLE CHEST 1 VIEW COMPARISON:  05/05/2005 FINDINGS: Single frontal view of the chest demonstrates an enlarged cardiac silhouette. Atherosclerosis of the aortic arch. Lung volumes are diminished, with central vascular congestion. No acute airspace disease, effusion, or pneumothorax. IMPRESSION: 1. Low lung volumes. 2. Central vascular congestion without overt edema. Electronically Signed   By: Randa Ngo M.D.   On: 05/20/2021 17:15   ECHOCARDIOGRAM COMPLETE  Result Date: 05/21/2021    ECHOCARDIOGRAM  REPORT   Patient Name:   Annette Fitzgerald Date of Exam: 05/21/2021 Medical Rec #:  [id number]        Height:       66.0 in Accession #:    [id number]       Weight:       155.0 lb Date of Birth:  [birth year]        BSA:          1.794 m Patient Age:    42 years         BP:           103/44 mmHg Patient Gender: F                HR:           69 bpm. Exam Location:  [facility] Procedure: 2D Echo, Cardiac Doppler and Color Doppler Indications:    Chest Pain  History:        Patient has no prior history of Echocardiogram examinations.                 Risk Factors:Hypertension and Diabetes.  Sonographer:    Wenda Low Referring Phys: [place]  1. Left ventricular ejection fraction, by estimation, is 50 to 55%. The left ventricle has low normal function. The left ventricle has no regional wall motion abnormalities. There is moderate concentric left ventricular hypertrophy. Left ventricular diastolic parameters are consistent with Grade I diastolic dysfunction (impaired relaxation).  2. Right ventricular systolic function is normal. The right ventricular size is normal. There is mildly elevated pulmonary artery systolic pressure.  3. The mitral valve is normal in structure. Trivial mitral valve regurgitation.  4. The aortic valve is tricuspid. There is mild calcification of the aortic valve. There is mild thickening of the aortic valve. Aortic valve regurgitation is not visualized. Aortic valve sclerosis/calcification is present, without any evidence of aortic stenosis. Comparison(s): No prior Echocardiogram. FINDINGS  Left Ventricle: Left ventricular ejection fraction, by estimation, is 50 to 55%. The left ventricle has low normal function. The left ventricle has no regional wall motion abnormalities. The left ventricular internal cavity size was normal in size. There is moderate concentric left ventricular hypertrophy. Abnormal (paradoxical) septal motion, consistent with left bundle  branch block. Left ventricular diastolic parameters are consistent with Grade I diastolic dysfunction (impaired relaxation). Right Ventricle: The right ventricular size is normal. No increase in right ventricular wall  thickness. Right ventricular systolic function is normal. There is mildly elevated pulmonary artery systolic pressure. The tricuspid regurgitant velocity is 2.92  m/s, and with an assumed right atrial pressure of 3 mmHg, the estimated right ventricular systolic pressure is [id number] mmHg. Left Atrium: Left atrial size was normal in size. Right Atrium: Right atrial size was normal in size. Pericardium: Trivial pericardial effusion is present. Mitral Valve: The mitral valve is normal in structure. There is mild thickening of the mitral valve leaflet(s). There is mild calcification of the mitral valve leaflet(s). Mild mitral annular calcification. Trivial mitral valve regurgitation. MV peak gradient, 4.3 mmHg. The mean mitral valve gradient is 2.0 mmHg. Tricuspid Valve: The tricuspid valve is normal in structure. Tricuspid valve regurgitation is trivial. Aortic Valve: The aortic valve is tricuspid. There is mild calcification of the aortic valve. There is mild thickening of the aortic valve. Aortic valve regurgitation is not visualized. Aortic valve sclerosis/calcification is present, without any evidence of aortic stenosis. Aortic valve mean gradient measures 5.0 mmHg. Aortic valve peak gradient measures 8.8 mmHg. Aortic valve area, by VTI measures 2.13 cm. Pulmonic Valve: The pulmonic valve was normal in structure. Pulmonic valve regurgitation is trivial. Aorta: The aortic root and ascending aorta are structurally normal, with no evidence of dilitation. Venous: The inferior vena cava was not well visualized. IAS/Shunts: No atrial level shunt detected by color flow Doppler.  LEFT VENTRICLE PLAX 2D LVIDd:         3.50 cm     Diastology LVIDs:         2.58 cm     LV e' medial:    6.96 cm/s LV PW:         1.30  cm     LV E/e' medial:  7.6 LV IVS:        1.30 cm     LV e' lateral:   7.07 cm/s LVOT diam:     2.00 cm     LV E/e' lateral: 7.5 LV SV:         73 LV SV Index:   41 LVOT Area:     3.14 cm  LV Volumes (MOD) LV vol d, MOD A2C: 74.1 ml LV vol d, MOD A4C: 90.9 ml LV vol s, MOD A2C: 35.3 ml LV vol s, MOD A4C: 43.4 ml LV SV MOD A2C:     38.8 ml LV SV MOD A4C:     90.9 ml LV SV MOD BP:      44.0 ml RIGHT VENTRICLE RV Basal diam:  3.90 cm RV Mid diam:    3.30 cm RV S prime:     12.00 cm/s TAPSE (M-mode): 2.2 cm LEFT ATRIUM             Index        RIGHT ATRIUM           Index LA diam:        3.60 cm 2.01 cm/m   RA Area:     15.00 cm LA Vol (A2C):   55.3 ml 30.82 ml/m  RA Volume:   38.50 ml  21.46 ml/m LA Vol (A4C):   41.3 ml 23.02 ml/m LA Biplane Vol: 48.5 ml 27.03 ml/m  AORTIC VALVE                    PULMONIC VALVE AV Area (Vmax):    2.67 cm     PV Vmax:       0.80 m/s AV Area (  Vmean):   2.27 cm     PV Peak grad:  2.6 mmHg AV Area (VTI):     2.13 cm AV Vmax:           148.00 cm/s AV Vmean:          99.700 cm/s AV VTI:            0.344 m AV Peak Grad:      8.8 mmHg AV Mean Grad:      5.0 mmHg LVOT Vmax:         126.00 cm/s LVOT Vmean:        72.000 cm/s LVOT VTI:          0.233 m LVOT/AV VTI ratio: 0.68  AORTA Ao Root diam: 2.80 cm Ao Asc diam:  2.90 cm MITRAL VALVE               TRICUSPID VALVE MV Area (PHT): 2.48 cm    TR Peak grad:   34.1 mmHg MV Area VTI:   2.40 cm    TR Vmax:        292.00 cm/s MV Peak grad:  4.3 mmHg MV Mean grad:  2.0 mmHg    SHUNTS MV Vmax:       1.04 m/s    Systemic VTI:  0.23 m MV Vmean:      58.3 cm/s   Systemic Diam: 2.00 cm MV Decel Time: 306 msec MV E velocity: 52.90 cm/s MV A velocity: 86.80 cm/s MV E/A ratio:  0.61 Gwyndolyn Kaufman MD Electronically signed by Gwyndolyn Kaufman MD Signature Date/Time: 05/21/2021/2:28:58 PM    Final      Scheduled Meds:  aspirin  81 mg Oral Daily   atorvastatin  10 mg Oral Daily   donepezil  10 mg Oral QHS   enoxaparin (LOVENOX) injection   40 mg Subcutaneous Q24H   gabapentin  300 mg Oral BID   [START ON 05/22/2021] glipiZIDE  10 mg Oral Q breakfast   insulin aspart  0-5 Units Subcutaneous QHS   insulin aspart  0-9 Units Subcutaneous TID WC   latanoprost  1 drop Both Eyes QHS   metoprolol tartrate  12.5 mg Oral BID   timolol  1 drop Both Eyes Daily   Continuous Infusions:  diltiazem (CARDIZEM) infusion       LOS: 0 days    Roxan Hockey M.D on 05/21/2021 at 4:11 PM  Go to [email or web address] - for contact info  [facility] - Office  [phone]  If 7PM-7AM, please contact night-coverage [email or web address] Password [email or web address] 05/21/2021, 4:11 PM

## 2021-05-21 NOTE — Progress Notes (Signed)
Patient transported to ICU by staff. Report given to [employer].

## 2021-05-21 NOTE — Progress Notes (Signed)
Obtained EKG upon arrival to ICU unit due to patient complaining of chest pain and HR noted to be 160's to 170's. EKG showed again A.Fib with RVR. Dr.Emokpae notified. Likely pain is from rapid HR. Per verbal order by Dr. Mariea Clonts, 1 dose 6 mg adenosine administered. HR dropped to 80's still in a.fib for less than 5 seconds and returned to 160's. Dr. Mariea Clonts notified.

## 2021-05-21 NOTE — Progress Notes (Signed)
°  Transition of Care [facility]) Screening Note   Patient Details  Name: Annette Fitzgerald Date of Birth: 05-06-1933   Transition of Care [facility]) CM/SW Contact:    Leitha Bleak, RN Phone Number: 05/21/2021, 12:58 PM  Transition of Care Department [facility]) has reviewed patient and no TOC needs have been identified at this time. We will continue to monitor patient advancement through interdisciplinary progression rounds. If new patient transition needs arise, please place a TOC consult.

## 2021-05-21 NOTE — Progress Notes (Signed)
°  Social/Ethics--plan of care, goals of care and advanced directives discussed with patient's daughter Levorn and patient's granddaughter Tammy at bedside  -They request full CODE STATUS without limitations to treatment at this time  Shon Hale, MD

## 2021-05-21 NOTE — Progress Notes (Signed)
*  PRELIMINARY RESULTS* Echocardiogram 2D Echocardiogram has been performed.  Annette Fitzgerald 05/21/2021, 12:39 PM

## 2021-05-21 NOTE — Progress Notes (Signed)
Tele called patient in A-fib Heart rate in the 150s. Patient sitting in bed eating lunch. Patient stated she did not feel any different. EKG ordered by MD Courage. IV Cardizem 10 mg given. Heart rate staying between 122-150. MD Courage made aware. New orders placed by MD.

## 2021-05-21 NOTE — Progress Notes (Signed)
RN called due to patient's BP at 90/60, patient continued to be in A. fib with RVR with rates in 140s/150s on Cardizem.  IV Cardizem was changed to IV amiodarone.

## 2021-05-22 LAB — GLUCOSE, CAPILLARY
Glucose-Capillary: 267 mg/dL — ABNORMAL HIGH (ref 70–99)
Glucose-Capillary: 309 mg/dL — ABNORMAL HIGH (ref 70–99)
Glucose-Capillary: 361 mg/dL — ABNORMAL HIGH (ref 70–99)
Glucose-Capillary: 143 mg/dL — ABNORMAL HIGH (ref 70–99)

## 2021-05-22 LAB — MRSA NEXT GEN BY PCR, NASAL: MRSA by PCR Next Gen: NOT DETECTED

## 2021-05-22 MED ORDER — INSULIN ASPART 100 UNIT/ML IJ SOLN
0.0000 [IU] | Freq: Three times a day (TID) | INTRAMUSCULAR | Status: DC
  Administered 2021-05-22: 20 [IU] via SUBCUTANEOUS
  Administered 2021-05-23 (×2): 7 [IU] via SUBCUTANEOUS
  Administered 2021-05-23: 11 [IU] via SUBCUTANEOUS
  Administered 2021-05-24 (×2): 4 [IU] via SUBCUTANEOUS
  Administered 2021-05-24: 11 [IU] via SUBCUTANEOUS

## 2021-05-22 MED ORDER — METOPROLOL TARTRATE 25 MG PO TABS
25.0000 mg | ORAL_TABLET | Freq: Two times a day (BID) | ORAL | Status: DC
  Administered 2021-05-22 – 2021-05-24 (×5): 25 mg via ORAL
  Filled 2021-05-22 (×5): qty 1

## 2021-05-22 MED ORDER — DILTIAZEM HCL 30 MG PO TABS
30.0000 mg | ORAL_TABLET | Freq: Four times a day (QID) | ORAL | Status: DC
  Administered 2021-05-22: 30 mg via ORAL
  Filled 2021-05-22: qty 1

## 2021-05-22 MED ORDER — INSULIN ASPART 100 UNIT/ML IJ SOLN
0.0000 [IU] | Freq: Every day | INTRAMUSCULAR | Status: DC
  Administered 2021-05-23: 2 [IU] via SUBCUTANEOUS

## 2021-05-22 NOTE — Progress Notes (Signed)
PO medication (Cardizem and metoprolol) given per Dr Marisa Severin orders. Per verbal from Dr Marisa Severin, keep amio drip going until completed which would have been around 1pm even after PO pills were given this AM. Writer turned amio drip off when patient heart rate in the 50's and briefly dipped in the high 40's at times. Patient alert and had no complaints. Dr Marisa Severin made aware. Will continue to monitor. Heart rate mainly in the 50's currently.

## 2021-05-22 NOTE — Progress Notes (Signed)
Dr Marisa Severin made aware that patient heart rate continues to be in the 50's. Patient with no complaints. Dr Marisa Severin made aware of patient blood sugar results/levels at lunchtime and dinnertime. New orders placed.

## 2021-05-22 NOTE — Progress Notes (Signed)
PROGRESS NOTE     Annette Fitzgerald, is a 87 y.o. female, DOB - Oct 19, 1932, [id number]  Admit date - 05/20/2021   Admitting Physician Jaelah Hauth Denton Brick, MD  Outpatient Primary MD for the patient is Rosita Fire, MD  LOS - 1  Chief Complaint  Patient presents with   Chest Pain      Brief Narrative:  87 y.o. female with medical history significant for hypertension, diabetes mellitus admitted on 05/20/2021 with dyspnea and acute hypoxic respiratory failure with O2 sats of 85% on room air O2 sats improved to 95% on 4 L of oxygen via nasal cannula -On 05/21/2021 patient went to A. fib with RVR with heart rate above 150 received IV Cardizem bolus remained tachycardic transferred to stepdown unit for continuous IV Cardizem drip  Assessment & Plan:   Principal Problem:   Acute respiratory failure with hypoxia (HCC) Active Problems:   Essential hypertension   Nonspecific chest pain   DM (diabetes mellitus) (HCC)   Dementia (HCC)   New onset a-fib ([place])   1)New onset Afib with RVR- --A. fib with RVR with heart rate above 150  -Initially failed IV Cardizem, however was placed on IV amiodarone overnight converted back to SR  -Had bradycardia with Cardizem with conversion -Okay to give metoprolol 25 mg twice daily -TSH 0.358 -Echo with EF of 50 to [id number], grade 1 diastolic dysfunction noted -No aortic stenosis or mitral stenosis, no atrial enlargement -Risk versus benefit of full anticoagulation discussed with patient and family members, they declined full anticoagulation at this time  CHA2DS2- VASc score   is = at least 17  (age x2, gender x 1, Aortic Plaque x 1, HTN x 1, DM x 1)  Which is  equal to =  % annual risk of stroke  This patients CHA2DS2-VASc Score and unadjusted Ischemic Stroke Rate (% per year) is equal to 9.7 % stroke rate/year from a score of 6 -Aspirin as prescribed  2) acute hypoxic respiratory failure--O2 sats of 85% on room air O2 sats improved to 95% on 4 L of oxygen  via nasal cannula -Attempted to wean down oxygen, patient became hypoxic with oxygen saturation of 86% on room air, placed back on 2 L of oxygen at this time -Currently  93 to 95% on 2 L -Suspect hypoxia is due to due to CHF and A. fib with RVR in the setting of underlying Emphysema/COPD  3)COPD/ emphysema--- CTA chest findings noted  -patient and family deny significant tobacco use or exposure -No wheezing --avoid bronchodilators given A. fib with RVR -Low-dose steroids as ordered  4)Chest  pains --- resolved, troponin 5>>5>>6 -EKG with persistent left bundle branch block  -Repeat EKG sinus rhythm  5)DM2-continue glipizide ,use Novolog/Humalog Sliding scale insulin with Accu-Cheks/Fingersticks as ordered   6)HTN--BP is somewhat soft, hold hydralazine and amlodipine due to soft BP, hold losartan due to soft BP and recent contrast exposure -Metoprolol 25 mg twice daily for rate control as above #1  7)Dementia --- requires help with ADLs, -Continue Aricept and as needed Xanax   Disposition/Need for in-Hospital Stay- patient unable to be discharged at this time due to --persistent hypoxia and A. fib with RVR requiring IV Cardizem drip and supplemental oxygen -Transfer to stepdown unit for Cardizem infusion -Possible discharge in 1 to 2 days if cardiorespiratory status improves  Status is: Inpatient  Remains inpatient appropriate because: Please see disposition above  Disposition: The patient is from: Home  Anticipated d/c is to: Home              Anticipated d/c date is: 1 day              Patient currently is not medically stable to d/c. Barriers: Not Clinically Stable-   Code Status :  -  Code Status: Full Code   Family Communication:   Discussed with daughter and grand daughters at bedside Consults  :  na  DVT Prophylaxis  :   - SCDs  enoxaparin (LOVENOX) injection 40 mg Start: 05/21/21 1000    Lab Results  Component Value Date   PLT 272 05/20/2021     Inpatient Medications  Scheduled Meds:  aspirin  81 mg Oral Daily   atorvastatin  10 mg Oral Daily   Chlorhexidine Gluconate Cloth  6 each Topical Daily   donepezil  10 mg Oral QHS   enoxaparin (LOVENOX) injection  40 mg Subcutaneous Q24H   gabapentin  300 mg Oral BID   glipiZIDE  10 mg Oral Q breakfast   insulin aspart  0-20 Units Subcutaneous TID WC   insulin aspart  0-5 Units Subcutaneous QHS   latanoprost  1 drop Both Eyes QHS   mouth rinse  15 mL Mouth Rinse BID   metoprolol tartrate  25 mg Oral BID   timolol  1 drop Both Eyes Daily   Continuous Infusions:   PRN Meds:.acetaminophen **OR** acetaminophen, albuterol, ALPRAZolam, oxyCODONE, polyethylene glycol, promethazine   Anti-infectives (From admission, onward)    None         Subjective: Annette Fitzgerald today has no fevers, no emesis,   -Patient's granddaughter's x2 at bedside, -Converted back to sinus rhythm, denies palpitations or chest pains, --hypoxia persist  Objective: Vitals:   05/22/21 1600 05/22/21 1601 05/22/21 1658 05/22/21 1700  BP: (!) 158/55  (!) 145/55 (!) 145/55  Pulse:  (!) 57 (!) 54 (!) 56  Resp:  19 17 (!) 24  Temp:   97.9 F (36.6 C)   TempSrc:   Axillary   SpO2: 94% 95% 95% 96%  Weight:      Height:        Intake/Output Summary (Last 24 hours) at 05/22/2021 1832 Last data filed at 05/22/2021 1605 Gross per 24 hour  Intake 394.63 ml  Output 350 ml  Net 44.63 ml   Filed Weights   05/20/21 1632 05/21/21 1645 05/22/21 0405  Weight: 70.3 kg 67.3 kg 68.3 kg     Physical Exam  Gen:- Awake Alert, no acute distress HEENT:- [place].AT, No sclera icterus Neck-Supple Neck,No JVD,.  Lungs-  CTAB , fair symmetrical air movement CV- S1, S2 normal, irregularly irregular and tachycardic  abd-  +ve B.Sounds, Abd Soft, No tenderness,    Extremity/Skin:- No  edema, pedal pulses present  Psych-affect is appropriate, baseline/underlying cognitive and memory deficits consistent with  moderate dementia  neuro-no new focal deficits, no tremors  Data Reviewed: I have personally reviewed following labs and imaging studies  CBC: Recent Labs  Lab 05/20/21 1713  WBC 12.5*  NEUTROABS 11.1*  HGB 12.5  HCT 39.1  MCV 96.5  PLT 272   Basic Metabolic Panel: Recent Labs  Lab 05/20/21 1713  NA 135  K 4.2  CL 101  CO2 24  GLUCOSE 170*  BUN 21  CREATININE 1.18*  CALCIUM 9.2   GFR: Estimated Creatinine Clearance: 32 mL/min (A) (by C-G formula based on SCr of 1.18 mg/dL (H)). Liver Function Tests: Recent Labs  Lab  05/20/21 1713  AST 21  ALT 16  ALKPHOS 95  BILITOT 0.9  PROT 8.6*  ALBUMIN 3.9   Recent Labs  Lab 05/20/21 1713  LIPASE 43   No results for input(s): AMMONIA in the last 168 hours. Coagulation Profile: No results for input(s): INR, PROTIME in the last 168 hours. Cardiac Enzymes: No results for input(s): CKTOTAL, CKMB, CKMBINDEX, TROPONINI in the last 168 hours. BNP (last 3 results) No results for input(s): PROBNP in the last 8760 hours. HbA1C: Recent Labs    05/20/21 1713  HGBA1C 8.6*   CBG: Recent Labs  Lab 05/21/21 1626 05/21/21 2220 05/22/21 0735 05/22/21 1153 05/22/21 1650  GLUCAP 154* 209* 267* 309* 361*   Lipid Profile: No results for input(s): CHOL, HDL, LDLCALC, TRIG, CHOLHDL, LDLDIRECT in the last 72 hours. Thyroid Function Tests: Recent Labs    05/20/21 1713  TSH 0.358   Anemia Panel: No results for input(s): VITAMINB12, FOLATE, FERRITIN, TIBC, IRON, RETICCTPCT in the last 72 hours. Urine analysis: No results found for: COLORURINE, APPEARANCEUR, LABSPEC, PHURINE, GLUCOSEU, HGBUR, BILIRUBINUR, KETONESUR, PROTEINUR, UROBILINOGEN, NITRITE, LEUKOCYTESUR Sepsis Labs: @LABRCNTIP (procalcitonin:4,lacticidven:4)  ) Recent Results (from the past 240 hour(s))  Resp Panel by RT-PCR (Flu A&B, Covid) Nasopharyngeal Swab     Status: None   Collection Time: 05/20/21  5:13 PM   Specimen: Nasopharyngeal Swab;  Nasopharyngeal(NP) swabs in vial transport medium  Result Value Ref Range Status   SARS Coronavirus 2 by RT PCR NEGATIVE NEGATIVE Final    Comment: (NOTE) SARS-CoV-2 target nucleic acids are NOT DETECTED.  The SARS-CoV-2 RNA is generally detectable in upper respiratory specimens during the acute phase of infection. The lowest concentration of SARS-CoV-2 viral copies this assay can detect is 138 copies/mL. A negative result does not preclude SARS-Cov-2 infection and should not be used as the sole basis for treatment or other patient management decisions. A negative result may occur with  improper specimen collection/handling, submission of specimen other than nasopharyngeal swab, presence of viral mutation(s) within the areas targeted by this assay, and inadequate number of viral copies(<138 copies/mL). A negative result must be combined with clinical observations, patient history, and epidemiological information. The expected result is Negative.  Fact Sheet for Patients:  EntrepreneurPulse.com.au  Fact Sheet for Healthcare Providers:  IncredibleEmployment.be  This test is no t yet approved or cleared by the [place] FDA and  has been authorized for detection and/or diagnosis of SARS-CoV-2 by FDA under an Emergency Use Authorization (EUA). This EUA will remain  in effect (meaning this test can be used) for the duration of the COVID-19 declaration under Section 564(b)(1) of the Act, 21 U.S.C.section 360bbb-3(b)(1), unless the authorization is terminated  or revoked sooner.       Influenza A by PCR NEGATIVE NEGATIVE Final   Influenza B by PCR NEGATIVE NEGATIVE Final    Comment: (NOTE) The Xpert Xpress SARS-CoV-2/FLU/RSV plus assay is intended as an aid in the diagnosis of influenza from Nasopharyngeal swab specimens and should not be used as a sole basis for treatment. Nasal washings and aspirates are unacceptable for Xpert Xpress  SARS-CoV-2/FLU/RSV testing.  Fact Sheet for Patients: EntrepreneurPulse.com.au  Fact Sheet for Healthcare Providers: IncredibleEmployment.be  This test is not yet approved or cleared by the [place] FDA and has been authorized for detection and/or diagnosis of SARS-CoV-2 by FDA under an Emergency Use Authorization (EUA). This EUA will remain in effect (meaning this test can be used) for the duration of the COVID-19 declaration under Section 564(b)(1) of  the Act, 21 U.S.C. section 360bbb-3(b)(1), unless the authorization is terminated or revoked.  Performed at [facility], [address], [place], [place] [address]   MRSA Next Gen by PCR, Nasal     Status: None   Collection Time: 05/21/21  4:30 PM   Specimen: Nasal Mucosa; Nasal Swab  Result Value Ref Range Status   MRSA by PCR Next Gen NOT DETECTED NOT DETECTED Final    Comment: (NOTE) The GeneXpert MRSA Assay (FDA approved for NASAL specimens only), is one component of a comprehensive MRSA colonization surveillance program. It is not intended to diagnose MRSA infection nor to guide or monitor treatment for MRSA infections. Test performance is not FDA approved in patients less than 6 years old. Performed at [facility], [address], [place], [place] [address]       Radiology Studies: CT Angio Chest PE W/Cm &/Or Wo Cm  Result Date: 05/20/2021 CLINICAL DATA:  Chest pain, shortness of breath EXAM: CT ANGIOGRAPHY CHEST WITH CONTRAST TECHNIQUE: Multidetector CT imaging of the chest was performed using the standard protocol during bolus administration of intravenous contrast. Multiplanar CT image reconstructions and MIPs were obtained to evaluate the vascular anatomy. CONTRAST:  29mL OMNIPAQUE IOHEXOL 350 MG/ML SOLN COMPARISON:  None. FINDINGS: Cardiovascular: Cardiomegaly. Diffuse coronary artery calcifications and aortic calcifications. No aneurysm. No filling defects in the pulmonary  arteries to suggest pulmonary emboli. Mediastinum/Nodes: No mediastinal, hilar, or axillary adenopathy. Trachea and esophagus are unremarkable. Thyroid unremarkable. Lungs/Pleura: Moderate emphysema. Bibasilar atelectasis. No confluent opacities or effusions. Upper Abdomen: Imaging into the upper abdomen demonstrates no acute findings. Musculoskeletal: Chest wall soft tissues are unremarkable. No acute bony abnormality. Review of the MIP images confirms the above findings. IMPRESSION: No evidence of pulmonary embolus. Cardiomegaly, coronary artery disease. Aortic Atherosclerosis (ICD10-I70.0) and Emphysema (ICD10-J43.9). Electronically Signed   By: Rolm Baptise M.D.   On: 05/20/2021 19:26   ECHOCARDIOGRAM COMPLETE  Result Date: 05/21/2021    ECHOCARDIOGRAM REPORT   Patient Name:   MELODIE POJE Date of Exam: 05/21/2021 Medical Rec #:  [id number]        Height:       66.0 in Accession #:    [id number]       Weight:       155.0 lb Date of Birth:  Aug 21, 1932        BSA:          1.794 m Patient Age:    68 years         BP:           103/44 mmHg Patient Gender: F                HR:           69 bpm. Exam Location:  [facility] Procedure: 2D Echo, Cardiac Doppler and Color Doppler Indications:    Chest Pain  History:        Patient has no prior history of Echocardiogram examinations.                 Risk Factors:Hypertension and Diabetes.  Sonographer:    Wenda Low Referring Phys: [place]  1. Left ventricular ejection fraction, by estimation, is 50 to 55%. The left ventricle has low normal function. The left ventricle has no regional wall motion abnormalities. There is moderate concentric left ventricular hypertrophy. Left ventricular diastolic parameters are consistent with Grade I diastolic dysfunction (impaired relaxation).  2. Right ventricular systolic function is  normal. The right ventricular size is normal. There is mildly elevated pulmonary artery systolic pressure.  3.  The mitral valve is normal in structure. Trivial mitral valve regurgitation.  4. The aortic valve is tricuspid. There is mild calcification of the aortic valve. There is mild thickening of the aortic valve. Aortic valve regurgitation is not visualized. Aortic valve sclerosis/calcification is present, without any evidence of aortic stenosis. Comparison(s): No prior Echocardiogram. FINDINGS  Left Ventricle: Left ventricular ejection fraction, by estimation, is 50 to 55%. The left ventricle has low normal function. The left ventricle has no regional wall motion abnormalities. The left ventricular internal cavity size was normal in size. There is moderate concentric left ventricular hypertrophy. Abnormal (paradoxical) septal motion, consistent with left bundle branch block. Left ventricular diastolic parameters are consistent with Grade I diastolic dysfunction (impaired relaxation). Right Ventricle: The right ventricular size is normal. No increase in right ventricular wall thickness. Right ventricular systolic function is normal. There is mildly elevated pulmonary artery systolic pressure. The tricuspid regurgitant velocity is 2.92  m/s, and with an assumed right atrial pressure of 3 mmHg, the estimated right ventricular systolic pressure is 37.1 mmHg. Left Atrium: Left atrial size was normal in size. Right Atrium: Right atrial size was normal in size. Pericardium: Trivial pericardial effusion is present. Mitral Valve: The mitral valve is normal in structure. There is mild thickening of the mitral valve leaflet(s). There is mild calcification of the mitral valve leaflet(s). Mild mitral annular calcification. Trivial mitral valve regurgitation. MV peak gradient, 4.3 mmHg. The mean mitral valve gradient is 2.0 mmHg. Tricuspid Valve: The tricuspid valve is normal in structure. Tricuspid valve regurgitation is trivial. Aortic Valve: The aortic valve is tricuspid. There is mild calcification of the aortic valve. There is  mild thickening of the aortic valve. Aortic valve regurgitation is not visualized. Aortic valve sclerosis/calcification is present, without any evidence of aortic stenosis. Aortic valve mean gradient measures 5.0 mmHg. Aortic valve peak gradient measures 8.8 mmHg. Aortic valve area, by VTI measures 2.13 cm. Pulmonic Valve: The pulmonic valve was normal in structure. Pulmonic valve regurgitation is trivial. Aorta: The aortic root and ascending aorta are structurally normal, with no evidence of dilitation. Venous: The inferior vena cava was not well visualized. IAS/Shunts: No atrial level shunt detected by color flow Doppler.  LEFT VENTRICLE PLAX 2D LVIDd:         3.50 cm     Diastology LVIDs:         2.58 cm     LV e' medial:    6.96 cm/s LV PW:         1.30 cm     LV E/e' medial:  7.6 LV IVS:        1.30 cm     LV e' lateral:   7.07 cm/s LVOT diam:     2.00 cm     LV E/e' lateral: 7.5 LV SV:         73 LV SV Index:   41 LVOT Area:     3.14 cm  LV Volumes (MOD) LV vol d, MOD A2C: 74.1 ml LV vol d, MOD A4C: 90.9 ml LV vol s, MOD A2C: 35.3 ml LV vol s, MOD A4C: 43.4 ml LV SV MOD A2C:     38.8 ml LV SV MOD A4C:     90.9 ml LV SV MOD BP:      44.0 ml RIGHT VENTRICLE RV Basal diam:  3.90 cm RV Mid diam:  3.30 cm RV S prime:     12.00 cm/s TAPSE (M-mode): 2.2 cm LEFT ATRIUM             Index        RIGHT ATRIUM           Index LA diam:        3.60 cm 2.01 cm/m   RA Area:     15.00 cm LA Vol (A2C):   55.3 ml 30.82 ml/m  RA Volume:   38.50 ml  21.46 ml/m LA Vol (A4C):   41.3 ml 23.02 ml/m LA Biplane Vol: 48.5 ml 27.03 ml/m  AORTIC VALVE                    PULMONIC VALVE AV Area (Vmax):    2.67 cm     PV Vmax:       0.80 m/s AV Area (Vmean):   2.27 cm     PV Peak grad:  2.6 mmHg AV Area (VTI):     2.13 cm AV Vmax:           148.00 cm/s AV Vmean:          99.700 cm/s AV VTI:            0.344 m AV Peak Grad:      8.8 mmHg AV Mean Grad:      5.0 mmHg LVOT Vmax:         126.00 cm/s LVOT Vmean:        72.000 cm/s LVOT  VTI:          0.233 m LVOT/AV VTI ratio: 0.68  AORTA Ao Root diam: 2.80 cm Ao Asc diam:  2.90 cm MITRAL VALVE               TRICUSPID VALVE MV Area (PHT): 2.48 cm    TR Peak grad:   34.1 mmHg MV Area VTI:   2.40 cm    TR Vmax:        292.00 cm/s MV Peak grad:  4.3 mmHg MV Mean grad:  2.0 mmHg    SHUNTS MV Vmax:       1.04 m/s    Systemic VTI:  0.23 m MV Vmean:      58.3 cm/s   Systemic Diam: 2.00 cm MV Decel Time: 306 msec MV E velocity: 52.90 cm/s MV A velocity: 86.80 cm/s MV E/A ratio:  0.61 Gwyndolyn Kaufman MD Electronically signed by Gwyndolyn Kaufman MD Signature Date/Time: 05/21/2021/2:28:58 PM    Final      Scheduled Meds:  aspirin  81 mg Oral Daily   atorvastatin  10 mg Oral Daily   Chlorhexidine Gluconate Cloth  6 each Topical Daily   donepezil  10 mg Oral QHS   enoxaparin (LOVENOX) injection  40 mg Subcutaneous Q24H   gabapentin  300 mg Oral BID   glipiZIDE  10 mg Oral Q breakfast   insulin aspart  0-20 Units Subcutaneous TID WC   insulin aspart  0-5 Units Subcutaneous QHS   latanoprost  1 drop Both Eyes QHS   mouth rinse  15 mL Mouth Rinse BID   metoprolol tartrate  25 mg Oral BID   timolol  1 drop Both Eyes Daily   Continuous Infusions:   LOS: 1 day    Roxan Hockey M.D on 05/22/2021 at 6:32 PM  Go to [email or web address] - for contact info  [facility] - Office  [phone]  If 7PM-7AM, please contact night-coverage  [email or web address] Password [facility] 05/22/2021, 6:32 PM

## 2021-05-22 NOTE — Progress Notes (Signed)
Pt converted and has remained converted to NSR since 0109.

## 2021-05-23 LAB — CBC
HCT: 34.6 % — ABNORMAL LOW (ref 36.0–46.0)
Hemoglobin: 11.2 g/dL — ABNORMAL LOW (ref 12.0–15.0)
MCH: 29.9 pg (ref 26.0–34.0)
MCHC: 32.4 g/dL (ref 30.0–36.0)
MCV: 92.5 fL (ref 80.0–100.0)
Platelets: 306 10*3/uL (ref 150–400)
RBC: 3.74 MIL/uL — ABNORMAL LOW (ref 3.87–5.11)
RDW: 14 % (ref 11.5–15.5)
WBC: 15.9 10*3/uL — ABNORMAL HIGH (ref 4.0–10.5)
nRBC: 0 % (ref 0.0–0.2)

## 2021-05-23 LAB — GLUCOSE, CAPILLARY
Glucose-Capillary: 231 mg/dL — ABNORMAL HIGH (ref 70–99)
Glucose-Capillary: 245 mg/dL — ABNORMAL HIGH (ref 70–99)
Glucose-Capillary: 268 mg/dL — ABNORMAL HIGH (ref 70–99)
Glucose-Capillary: 236 mg/dL — ABNORMAL HIGH (ref 70–99)

## 2021-05-23 LAB — BASIC METABOLIC PANEL
Anion gap: 10 (ref 5–15)
BUN: 55 mg/dL — ABNORMAL HIGH (ref 8–23)
CO2: 24 mmol/L (ref 22–32)
Calcium: 8.2 mg/dL — ABNORMAL LOW (ref 8.9–10.3)
Chloride: 97 mmol/L — ABNORMAL LOW (ref 98–111)
Creatinine, Ser: 1.66 mg/dL — ABNORMAL HIGH (ref 0.44–1.00)
GFR, Estimated: 30 mL/min — ABNORMAL LOW (ref >60)
Glucose, Bld: 221 mg/dL — ABNORMAL HIGH (ref 70–99)
Potassium: 4.1 mmol/L (ref 3.5–5.1)
Sodium: 131 mmol/L — ABNORMAL LOW (ref 135–145)

## 2021-05-23 MED ORDER — PREDNISONE 20 MG PO TABS
20.0000 mg | ORAL_TABLET | Freq: Every day | ORAL | Status: DC
  Administered 2021-05-23 – 2021-05-24 (×2): 20 mg via ORAL
  Filled 2021-05-23 (×2): qty 1

## 2021-05-23 MED ORDER — ENOXAPARIN SODIUM 30 MG/0.3ML IJ SOSY
30.0000 mg | PREFILLED_SYRINGE | INTRAMUSCULAR | Status: DC
  Administered 2021-05-23 – 2021-05-24 (×2): 30 mg via SUBCUTANEOUS
  Filled 2021-05-23 (×2): qty 0.3

## 2021-05-23 MED ORDER — SODIUM CHLORIDE 0.9 % IV SOLN: INTRAVENOUS | Status: AC

## 2021-05-23 NOTE — Evaluation (Signed)
Physical Therapy Evaluation Patient Details Name: Annette Fitzgerald MRN: [id number] DOB: [birth year] Today's Date: 05/23/2021  History of Present Illness  Annette Fitzgerald is a 87 y.o. female with medical history significant for hypertension, diabetes mellitus.  Patient was brought to the ED by ED EMS reports of sudden chest pain and difficulty breathing.  On my evaluation Daughter Annette Fitzgerald is at bedside, and helps with history.  Patient does not remember details of earlier today.  Patient's other daughter Annette Fitzgerald had just finished giving patient a bath, when suddenly patient cluctched her chest and said she was having difficulty breathing. At the time of my evaluation, patient is calm, not in any distress whatsoever.  She denies any current chest pain or difficulty breathing and does not remember having any of these symptoms earlier.   Clinical Impression  Patient does not require assist for bed mobility or transfer to standing. Patient demonstrating greatest deficit with balance with ambulation with intermittent unsteadiness. Patient ambulates with bilateral knees lacking TKE (L>R) which likely contributes to balance deficits. Patient educated on use of RW to improve balance and reduce risk of falls upon returning home. Patient will benefit from continued skilled physical therapy in hospital and recommended venue below to increase strength, balance, endurance for safe ADLs and gait.        Recommendations for follow up therapy are one component of a multi-disciplinary discharge planning process, led by the attending physician.  Recommendations may be updated based on patient status, additional functional criteria and insurance authorization.  Follow Up Recommendations Home health PT    Assistance Recommended at Discharge Intermittent Supervision/Assistance  Functional Status Assessment Patient has had a recent decline in their functional status and demonstrates the ability to make significant  improvements in function in a reasonable and predictable amount of time.  Equipment Recommendations  Rolling walker (2 wheels)    Recommendations for Other Services       Precautions / Restrictions Precautions Precautions: Fall Restrictions Weight Bearing Restrictions: No      Mobility  Bed Mobility Overal bed mobility: Modified Independent             General bed mobility comments: HOB elevated slightly    Transfers Overall transfer level: Modified independent Equipment used: None                    Ambulation/Gait Ambulation/Gait assistance: Min guard Gait Distance (Feet): 50 Feet Assistive device: None Gait Pattern/deviations: Knee flexed in stance - right;Knee flexed in stance - left;Step-through pattern;Decreased stride length;Drifts right/left Gait velocity: decreased     General Gait Details: patient with intermittent unsteadiness, bilateral knees flexed L>R  Stairs            Wheelchair Mobility    Modified Rankin (Stroke Patients Only)       Balance Overall balance assessment: Needs assistance Sitting-balance support: No upper extremity supported;Feet supported   Sitting balance - Comments: seated EOB   Standing balance support: No upper extremity supported Standing balance-Leahy Scale: Fair Standing balance comment: without AD                             Pertinent Vitals/Pain Pain Assessment: No/denies pain    Home Living Family/patient expects to be discharged to:: Private residence Living Arrangements: Children Available Help at Discharge: Family Type of Home: House Home Access: Stairs to enter Entrance Stairs-Rails: Right Entrance Stairs-Number of Steps: 3-4 Alternate Level Stairs-Number of Steps: 20  Home Layout: Two level Home Equipment: Shower seat      Prior Function Prior Level of Function : Independent/Modified Independent             Mobility Comments: states short distance community  ambulator without AD ADLs Comments: Independent     Hand Dominance        Extremity/Trunk Assessment   Upper Extremity Assessment Upper Extremity Assessment: Overall WFL for tasks assessed    Lower Extremity Assessment Lower Extremity Assessment: Overall WFL for tasks assessed    Cervical / Trunk Assessment Cervical / Trunk Assessment: Normal  Communication   Communication: No difficulties  Cognition Arousal/Alertness: Awake/alert   Overall Cognitive Status: Within Functional Limits for tasks assessed                                          General Comments      Exercises     Assessment/Plan    PT Assessment Patient needs continued PT services  PT Problem List Decreased strength;Decreased activity tolerance;Decreased balance;Decreased mobility;Decreased safety awareness;Decreased knowledge of use of DME       PT Treatment Interventions DME instruction;Balance training;Gait training;Neuromuscular re-education;Stair training;Functional mobility training;Patient/family education;Therapeutic activities;Therapeutic exercise;Manual techniques    PT Goals (Current goals can be found in the Care Plan section)  Acute Rehab PT Goals Patient Stated Goal: return home PT Goal Formulation: With patient Time For Goal Achievement: 05/30/21 Potential to Achieve Goals: Good    Frequency Min 3X/week   Barriers to discharge        Co-evaluation               AM-PAC PT "6 Clicks" Mobility  Outcome Measure Help needed turning from your back to your side while in a flat bed without using bedrails?: None Help needed moving from lying on your back to sitting on the side of a flat bed without using bedrails?: None Help needed moving to and from a bed to a chair (including a wheelchair)?: A Little Help needed standing up from a chair using your arms (e.g., wheelchair or bedside chair)?: A Little Help needed to walk in hospital room?: A Little Help needed  climbing 3-5 steps with a railing? : A Little 6 Click Score: 20    End of Session   Activity Tolerance: Patient tolerated treatment well Patient left: in bed;with family/visitor present;with call bell/phone within reach;with nursing/sitter in room   PT Visit Diagnosis: Unsteadiness on feet (R26.81);Other abnormalities of gait and mobility (R26.89)    Time: 1310-1336 PT Time Calculation (min) (ACUTE ONLY): 26 min   Charges:   PT Evaluation $PT Eval Low Complexity: 1 Low PT Treatments $Therapeutic Activity: 8-22 mins        2:19 PM, 05/23/21 Wyman Songster PT, DPT Physical Therapist at [facility]

## 2021-05-23 NOTE — Progress Notes (Signed)
PROGRESS NOTE     Annette Fitzgerald, is a 87 y.o. female, DOB - [birth year], [id number]  Admit date - 05/20/2021   Admitting Physician Lason Eveland Denton Brick, MD  Outpatient Primary MD for the patient is Rosita Fire, MD  LOS - 2  Chief Complaint  Patient presents with   Chest Pain      Brief Narrative:  87 y.o. female with medical history significant for hypertension, diabetes mellitus admitted on 05/20/2021 with dyspnea and acute hypoxic respiratory failure with O2 sats of 85% on room air O2 sats improved to 95% on 4 L of oxygen via nasal cannula -On 05/21/2021 patient went to A. fib with RVR with heart rate above 150 received IV Cardizem bolus remained tachycardic transferred to stepdown unit for continuous IV Cardizem drip  Assessment & Plan:   Principal Problem:   Acute respiratory failure with hypoxia (HCC) Active Problems:   Essential hypertension   Nonspecific chest pain   DM (diabetes mellitus) (HCC)   Dementia (HCC)   New onset a-fib ([place])   1)New onset Afib with RVR- --A. fib with RVR with heart rate above 150  -Initially failed IV Cardizem, however was placed on IV amiodarone overnight converted back to SR  -Had bradycardia with Cardizem after she converted to sinus rhythm Continue p.o. metoprolol 25 mg twice daily -TSH 0.358 -Echo with EF of 50 to [id number], grade 1 diastolic dysfunction noted -No aortic stenosis or mitral stenosis, no atrial enlargement -Risk versus benefit of full anticoagulation discussed with patient and family members, they declined full anticoagulation at this time  CHA2DS2- VASc score   is = at least 68  (age x2, gender x 1, Aortic Plaque x 1, HTN x 1, DM x 1)  Which is  equal to =  % annual risk of stroke  This patients CHA2DS2-VASc Score and unadjusted Ischemic Stroke Rate (% per year) is equal to 9.7 % stroke rate/year from a score of 6 -Aspirin as prescribed  2) acute hypoxic respiratory failure--O2 sats of 85% on room air O2 sats improved to  95% on 4 L of oxygen via nasal cannula -Attempted to wean down oxygen, patient became hypoxic with oxygen saturation of 86% on room air, placed back on 2 L of oxygen at this time -Currently  93 to 95% on 2 L -Suspect hypoxia is due to due to CHF and A. fib with RVR in the setting of underlying Emphysema/COPD  3)COPD/ emphysema--- CTA chest findings noted  -patient and family deny significant tobacco use or exposure -No wheezing --avoid bronchodilators given A. fib with RVR -Low-dose steroids as ordered  4)Chest  pains --- resolved, troponin 5>>5>>6 -EKG with persistent left bundle branch block  -Repeat EKG sinus rhythm  5)DM2-continue glipizide ,use Novolog/Humalog Sliding scale insulin with Accu-Cheks/Fingersticks as ordered   6)HTN--BP is somewhat soft, hold hydralazine and amlodipine due to soft BP, hold losartan due to soft BP and recent contrast exposure -Metoprolol 25 mg twice daily for rate control as above #1  7)Dementia --- requires help with ADLs, -Continue Aricept and as needed Xanax  8)AKI----acute kidney injury -creatinine jumped to 1.66 from 1.1 -Suspect possible ATN in the setting of cardiac arrhythmia with transient hypotension compounded by poor oral intake -Start IV fluids--hydrate gently due to concerns for CHF renally adjust medications, avoid nephrotoxic agents / dehydration  / hypotension  9) generalized weakness--- PT eval appreciated recommends home health PT   Disposition/Need for in-Hospital Stay- patient unable to be discharged at this time due to --  persistent hypoxia and A. fib with RVR -Possible discharge in 1 to 2 days if cardiorespiratory status improves,, and if renal function improved with gentle hydration  Status is: Inpatient  Remains inpatient appropriate because: Please see disposition above  Disposition: The patient is from: Home              Anticipated d/c is to: Home              Anticipated d/c date is: 1 day              Patient  currently is not medically stable to d/c. Barriers: Not Clinically Stable-   Code Status :  -  Code Status: Full Code   Family Communication:   Discussed with daughter and grand daughters at bedside Consults  :  na  DVT Prophylaxis  :   - SCDs  enoxaparin (LOVENOX) injection 30 mg Start: 05/23/21 1000    Lab Results  Component Value Date   PLT 306 05/23/2021    Inpatient Medications  Scheduled Meds:  aspirin  81 mg Oral Daily   atorvastatin  10 mg Oral Daily   Chlorhexidine Gluconate Cloth  6 each Topical Daily   donepezil  10 mg Oral QHS   enoxaparin (LOVENOX) injection  30 mg Subcutaneous Q24H   gabapentin  300 mg Oral BID   glipiZIDE  10 mg Oral Q breakfast   insulin aspart  0-20 Units Subcutaneous TID WC   insulin aspart  0-5 Units Subcutaneous QHS   latanoprost  1 drop Both Eyes QHS   mouth rinse  15 mL Mouth Rinse BID   metoprolol tartrate  25 mg Oral BID   predniSONE  20 mg Oral Q breakfast   timolol  1 drop Both Eyes Daily   Continuous Infusions:  sodium chloride 50 mL/hr at 05/23/21 1102    PRN Meds:.acetaminophen **OR** acetaminophen, albuterol, ALPRAZolam, oxyCODONE, polyethylene glycol, promethazine   Anti-infectives (From admission, onward)    None         Subjective: Annette Fitzgerald today has no fevers, no emesis,    -Patient daughter is at bedside, oral intake is not great, urine output is poor -  Objective: Vitals:   05/23/21 1200 05/23/21 1206 05/23/21 1257 05/23/21 1811  BP: 113/74  113/63   Pulse: (!) 59  61   Resp: (!) 22  20   Temp:  (!) 97.5 F (36.4 C) 98.3 F (36.8 C) 98.2 F (36.8 C)  TempSrc:  Axillary Oral Oral  SpO2: 99%  91%   Weight:      Height:        Intake/Output Summary (Last 24 hours) at 05/23/2021 1902 Last data filed at 05/23/2021 1800 Gross per 24 hour  Intake 318.25 ml  Output 650 ml  Net -331.75 ml   Filed Weights   05/21/21 1645 05/22/21 0405 05/23/21 0344  Weight: 67.3 kg 68.3 kg 68.9 kg      Physical Exam  Gen:- Awake Alert, no acute distress HEENT:- Annette Fitzgerald, No sclera icterus Neck-Supple Neck,No JVD,.  Lungs-  CTAB , fair symmetrical air movement CV- S1, S2 normal, irregular  abd-  +ve B.Sounds, Abd Soft, No tenderness,    Extremity/Skin:- No  edema, pedal pulses present  Psych-affect is appropriate, baseline/underlying cognitive and memory deficits consistent with moderate dementia  neuro-generalized weakness, no new focal deficits, no tremors  Data Reviewed: I have personally reviewed following labs and imaging studies  CBC: Recent Labs  Lab 05/20/21 1713 05/23/21 0424  WBC 12.5* 15.9*  NEUTROABS 11.1*  --   HGB 12.5 11.2*  HCT 39.1 34.6*  MCV 96.5 92.5  PLT 272 [id number]   Basic Metabolic Panel: Recent Labs  Lab 05/20/21 1713 05/23/21 0424  NA 135 131*  K 4.2 4.1  CL 101 97*  CO2 24 24  GLUCOSE 170* 221*  BUN 21 55*  CREATININE 1.18* 1.66*  CALCIUM 9.2 8.2*   GFR: Estimated Creatinine Clearance: 22.8 mL/min (A) (by C-G formula based on SCr of 1.66 mg/dL (H)). Liver Function Tests: Recent Labs  Lab 05/20/21 1713  AST 21  ALT 16  ALKPHOS 95  BILITOT 0.9  PROT 8.6*  ALBUMIN 3.9   Recent Labs  Lab 05/20/21 1713  LIPASE 43   No results for input(s): AMMONIA in the last 168 hours. Coagulation Profile: No results for input(s): INR, PROTIME in the last 168 hours. Cardiac Enzymes: No results for input(s): CKTOTAL, CKMB, CKMBINDEX, TROPONINI in the last 168 hours. BNP (last 3 results) No results for input(s): PROBNP in the last 8760 hours. HbA1C: No results for input(s): HGBA1C in the last 72 hours.  CBG: Recent Labs  Lab 05/22/21 1650 05/22/21 2042 05/23/21 0807 05/23/21 1158 05/23/21 1559  GLUCAP 361* 143* 231* 245* 268*   Lipid Profile: No results for input(s): CHOL, HDL, LDLCALC, TRIG, CHOLHDL, LDLDIRECT in the last 72 hours. Thyroid Function Tests: No results for input(s): TSH, T4TOTAL, FREET4, T3FREE, THYROIDAB in the last  72 hours.  Anemia Panel: No results for input(s): VITAMINB12, FOLATE, FERRITIN, TIBC, IRON, RETICCTPCT in the last 72 hours. Urine analysis: No results found for: COLORURINE, APPEARANCEUR, LABSPEC, PHURINE, GLUCOSEU, HGBUR, BILIRUBINUR, KETONESUR, PROTEINUR, UROBILINOGEN, NITRITE, LEUKOCYTESUR Sepsis Labs: @LABRCNTIP (procalcitonin:4,lacticidven:4)  ) Recent Results (from the past 240 hour(s))  Resp Panel by RT-PCR (Flu A&B, Covid) Nasopharyngeal Swab     Status: None   Collection Time: 05/20/21  5:13 PM   Specimen: Nasopharyngeal Swab; Nasopharyngeal(NP) swabs in vial transport medium  Result Value Ref Range Status   SARS Coronavirus 2 by RT PCR NEGATIVE NEGATIVE Final    Comment: (NOTE) SARS-CoV-2 target nucleic acids are NOT DETECTED.  The SARS-CoV-2 RNA is generally detectable in upper respiratory specimens during the acute phase of infection. The lowest concentration of SARS-CoV-2 viral copies this assay can detect is 138 copies/mL. A negative result does not preclude SARS-Cov-2 infection and should not be used as the sole basis for treatment or other patient management decisions. A negative result may occur with  improper specimen collection/handling, submission of specimen other than nasopharyngeal swab, presence of viral mutation(s) within the areas targeted by this assay, and inadequate number of viral copies(<138 copies/mL). A negative result must be combined with clinical observations, patient history, and epidemiological information. The expected result is Negative.  Fact Sheet for Patients:  EntrepreneurPulse.com.au  Fact Sheet for Healthcare Providers:  IncredibleEmployment.be  This test is no t yet approved or cleared by the [place] FDA and  has been authorized for detection and/or diagnosis of SARS-CoV-2 by FDA under an Emergency Use Authorization (EUA). This EUA will remain  in effect (meaning this test can be used) for  the duration of the COVID-19 declaration under Section 564(b)(1) of the Act, 21 U.S.C.section 360bbb-3(b)(1), unless the authorization is terminated  or revoked sooner.       Influenza A by PCR NEGATIVE NEGATIVE Final   Influenza B by PCR NEGATIVE NEGATIVE Final    Comment: (NOTE) The Xpert Xpress SARS-CoV-2/FLU/RSV plus assay is intended as an aid in  the diagnosis of influenza from Nasopharyngeal swab specimens and should not be used as a sole basis for treatment. Nasal washings and aspirates are unacceptable for Xpert Xpress SARS-CoV-2/FLU/RSV testing.  Fact Sheet for Patients: BloggerCourse.com  Fact Sheet for Healthcare Providers: SeriousBroker.it  This test is not yet approved or cleared by the [place] FDA and has been authorized for detection and/or diagnosis of SARS-CoV-2 by FDA under an Emergency Use Authorization (EUA). This EUA will remain in effect (meaning this test can be used) for the duration of the COVID-19 declaration under Section 564(b)(1) of the Act, 21 U.S.C. section 360bbb-3(b)(1), unless the authorization is terminated or revoked.  Performed at [facility], [address], [place], [place] [address]   MRSA Next Gen by PCR, Nasal     Status: None   Collection Time: 05/21/21  4:30 PM   Specimen: Nasal Mucosa; Nasal Swab  Result Value Ref Range Status   MRSA by PCR Next Gen NOT DETECTED NOT DETECTED Final    Comment: (NOTE) The GeneXpert MRSA Assay (FDA approved for NASAL specimens only), is one component of a comprehensive MRSA colonization surveillance program. It is not intended to diagnose MRSA infection nor to guide or monitor treatment for MRSA infections. Test performance is not FDA approved in patients less than 72 years old. Performed at [facility], [address], [place], [place] [address]       Radiology Studies: No results found.   Scheduled Meds:  aspirin  81 mg Oral Daily    atorvastatin  10 mg Oral Daily   Chlorhexidine Gluconate Cloth  6 each Topical Daily   donepezil  10 mg Oral QHS   enoxaparin (LOVENOX) injection  30 mg Subcutaneous Q24H   gabapentin  300 mg Oral BID   glipiZIDE  10 mg Oral Q breakfast   insulin aspart  0-20 Units Subcutaneous TID WC   insulin aspart  0-5 Units Subcutaneous QHS   latanoprost  1 drop Both Eyes QHS   mouth rinse  15 mL Mouth Rinse BID   metoprolol tartrate  25 mg Oral BID   predniSONE  20 mg Oral Q breakfast   timolol  1 drop Both Eyes Daily   Continuous Infusions:  sodium chloride 50 mL/hr at 05/23/21 1102    LOS: 2 days    Shon Hale M.D on 05/23/2021 at 7:02 PM  Go to [email or web address] - for contact info  [facility] - Office  [phone]  If 7PM-7AM, please contact night-coverage [email or web address] Password [email or web address] 05/23/2021, 7:02 PM

## 2021-05-23 NOTE — Progress Notes (Signed)
Pt arrived via bed from ICU to room #314. Pt alert and oriented to person, place but disoriented to time and situation. IVF infusing per order, IV site without s/s infiltration. Family at bedside.Call bell within reach bed alarm on for safety.

## 2021-05-23 NOTE — Care Management Important Message (Signed)
Important Message  Patient Details  Name: Annette Fitzgerald MRN: [id number] Date of Birth: [birth year]   Medicare Important Message Given:  Yes     Corey Harold 05/23/2021, 3:37 PM

## 2021-05-23 NOTE — Progress Notes (Signed)
Patient pleasantly confused. Assisted to and from the restroom with minimal assist. Had small bowel movement. Provided prune juice to patient.

## 2021-05-23 NOTE — TOC Initial Note (Signed)
Transition of Care [facility]) - Initial/Assessment Note    Patient Details  Name: Annette Fitzgerald MRN: [id number] Date of Birth: 1933-05-27  Transition of Care [facility]) CM/SW Contact:    Leitha Bleak, RN Phone Number: 05/23/2021, 4:00 PM  Clinical Narrative:      Patient admitted with acute respiratory failure with hypoxia. Daughter lives with her mother, patient usually walks independently without aid of cane or walker. PT is recommending HHPT. Patient is agreeable.  Denyse Amass with [facility] accepted the referral. MD asked for orders.                     Expected Discharge Plan: Home w Home Health Services Barriers to Discharge: Continued Medical Work up   Patient Goals and CMS Choice Patient states their goals for this hospitalization and ongoing recovery are:: agreeable to home health. CMS Medicare.gov Compare Post Acute Care list provided to:: Patient Represenative (must comment) Choice offered to / list presented to : Adult Children  Expected Discharge Plan and Services Expected Discharge Plan: Home w Home Health Services      Living arrangements for the past 2 months: Single Family Home Expected Discharge Date: 05/21/21                    HH Arranged: PT HH Agency: [facility] Date [facility] Agency Contacted: 05/23/21 Time HH Agency Contacted: 1540 Representative spoke with at [facility] Agency: Denyse Amass  Prior Living Arrangements/Services Living arrangements for the past 2 months: Single Family Home Lives with:: Adult Children            Activities of Daily Living Home Assistive Devices/Equipment: None ADL Screening (condition at time of admission) Patient's cognitive ability adequate to safely complete daily activities?: No (h/o dementia) Is the patient deaf or have difficulty hearing?: Yes Does the patient have difficulty seeing, even when wearing glasses/contacts?: No Does the patient have difficulty concentrating, remembering, or making decisions?: Yes Patient able to  express need for assistance with ADLs?: Yes Does the patient have difficulty dressing or bathing?: No Independently performs ADLs?: Yes (appropriate for developmental age) Does the patient have difficulty walking or climbing stairs?: Yes Weakness of Legs: Both Weakness of Arms/Hands: None  Permission Sought/Granted    Emotional Assessment        Alcohol / Substance Use: Not Applicable Psych Involvement: No (comment)  Admission diagnosis:  Hypoxia [R09.02] Acute respiratory failure with hypoxia (HCC) [J96.01] Nonspecific chest pain [R07.9] New onset a-fib [facility]) [I48.91] Patient Active Problem List   Diagnosis Date Noted   New onset a-fib (HCC) 05/21/2021   Acute respiratory failure with hypoxia (HCC) 05/20/2021   Nonspecific chest pain 05/20/2021   DM (diabetes mellitus) (HCC) 05/20/2021   Dementia (HCC) 05/20/2021   Essential hypertension 10/13/2019   Stage 3a chronic kidney disease (HCC) 10/13/2019   Type II or unspecified type diabetes mellitus with renal manifestations, uncontrolled 10/13/2019   PCP:  Avon Gully, MD Pharmacy:   [facility] [phone] - [place], [place] - [address] [place] [address] Phone: [phone] Fax: [phone]   Readmission Risk Interventions Readmission Risk Prevention Plan 05/23/2021  Transportation Screening Complete  PCP or Specialist Appt within 5-7 Days Not Complete  Home Care Screening Complete  Medication Review (RN CM) Complete  Some recent data might be hidden

## 2021-05-23 NOTE — Plan of Care (Signed)
°  Problem: Acute Rehab PT Goals(only PT should resolve) Goal: Pt Will Ambulate Outcome: Progressing Flowsheets (Taken 05/23/2021 1420) Pt will Ambulate:  100 feet  with least restrictive assistive device  with supervision  with min guard assist Goal: Pt/caregiver will Perform Home Exercise Program Outcome: Progressing Flowsheets (Taken 05/23/2021 1420) Pt/caregiver will Perform Home Exercise Program:  For increased strengthening  For improved balance  Independently  2:20 PM, 05/23/21 Wyman Songster PT, DPT Physical Therapist at [facility]

## 2021-05-24 DIAGNOSIS — I4891 Unspecified atrial fibrillation: Secondary | ICD-10-CM

## 2021-05-24 LAB — BASIC METABOLIC PANEL
Anion gap: 6 (ref 5–15)
BUN: 64 mg/dL — ABNORMAL HIGH (ref 8–23)
CO2: 28 mmol/L (ref 22–32)
Calcium: 8.3 mg/dL — ABNORMAL LOW (ref 8.9–10.3)
Chloride: 101 mmol/L (ref 98–111)
Creatinine, Ser: 1.52 mg/dL — ABNORMAL HIGH (ref 0.44–1.00)
GFR, Estimated: 33 mL/min — ABNORMAL LOW (ref >60)
Glucose, Bld: 157 mg/dL — ABNORMAL HIGH (ref 70–99)
Potassium: 4.1 mmol/L (ref 3.5–5.1)
Sodium: 135 mmol/L (ref 135–145)

## 2021-05-24 LAB — GLUCOSE, CAPILLARY
Glucose-Capillary: 163 mg/dL — ABNORMAL HIGH (ref 70–99)
Glucose-Capillary: 162 mg/dL — ABNORMAL HIGH (ref 70–99)
Glucose-Capillary: 294 mg/dL — ABNORMAL HIGH (ref 70–99)

## 2021-05-24 MED ORDER — ACETAMINOPHEN 325 MG PO TABS: 650.0000 mg | ORAL_TABLET | Freq: Four times a day (QID) | ORAL | 0 refills | Status: DC | PRN

## 2021-05-24 MED ORDER — DILTIAZEM HCL ER COATED BEADS 120 MG PO CP24: 120.0000 mg | ORAL_CAPSULE | Freq: Every day | ORAL | 11 refills | Status: DC

## 2021-05-24 MED ORDER — LOSARTAN POTASSIUM 25 MG PO TABS: 12.5000 mg | ORAL_TABLET | Freq: Every day | ORAL | 0 refills | Status: DC

## 2021-05-24 MED ORDER — ASPIRIN EC 81 MG PO TBEC: 81.0000 mg | DELAYED_RELEASE_TABLET | Freq: Every day | ORAL | 2 refills | Status: DC

## 2021-05-24 MED ORDER — PREDNISONE 20 MG PO TABS: 20.0000 mg | ORAL_TABLET | Freq: Every day | ORAL | 0 refills | Status: DC

## 2021-05-24 MED ORDER — FUROSEMIDE 20 MG PO TABS: 20.0000 mg | ORAL_TABLET | Freq: Every day | ORAL | 1 refills | Status: DC

## 2021-05-24 MED ORDER — DILTIAZEM HCL 30 MG PO TABS
30.0000 mg | ORAL_TABLET | Freq: Three times a day (TID) | ORAL | Status: DC
  Administered 2021-05-24: 30 mg via ORAL
  Filled 2021-05-24: qty 1

## 2021-05-24 MED ORDER — POTASSIUM CHLORIDE ER 10 MEQ PO TBCR: 10.0000 meq | EXTENDED_RELEASE_TABLET | Freq: Every day | ORAL | 2 refills | Status: DC

## 2021-05-24 MED ORDER — POLYETHYLENE GLYCOL 3350 17 G PO PACK: 17.0000 g | PACK | Freq: Every day | ORAL | 0 refills | Status: DC | PRN

## 2021-05-24 NOTE — Progress Notes (Signed)
Stat EKG done, results given to RN.

## 2021-05-24 NOTE — Progress Notes (Signed)
Patient's heart rate 110's-130's on telemetry. EKG obtained showing Afib RVR with a rate of 119.BP 108/76. Dr. Thomes Dinning notified. No new orders at this time. Will continue to monitor.

## 2021-05-24 NOTE — Progress Notes (Signed)
Discharge instructions read to patient and her 2 daughters.  All verbalized understanding of all instructions.  Patient discharged top home with her daughters via private auto.

## 2021-05-24 NOTE — Progress Notes (Signed)
Patient ambulated in hall to nursing station and back to room.  O2 sat remained 91% on room air. HR 76

## 2021-05-24 NOTE — Discharge Instructions (Signed)
1)Please Note that there are several changes to your medications 2)follow up with Cardiologist as advised for atrial fibrillation (irregular Heart beats) management  3)Very low-salt diet advised 4)Weigh yourself daily, call if you gain more than 3 pounds in 1 day or more than 5 pounds in 1 week as your diuretic medications may need to be adjusted 5)Repeat CBC and BMP blood test with primary care physician in about a week or so advised

## 2021-05-24 NOTE — Discharge Summary (Addendum)
Annette Fitzgerald, is a 87 y.o. female  DOB [birth year]  MRN [id number].  Admission date:  05/20/2021  Admitting Physician  Roxan Hockey, MD  Discharge Date:  05/24/2021   Primary MD  Rosita Fire, MD  Recommendations for primary care physician for things to follow:  1)Please Note that there are several changes to your medications 2)follow up with Cardiologist as advised for atrial fibrillation (irregular Heart beats) management  3)Very low-salt diet advised 4)Weigh yourself daily, call if you gain more than 3 pounds in 1 day or more than 5 pounds in 1 week as your diuretic medications may need to be adjusted 5)Repeat CBC and BMP blood test with primary care physician in about a week or so advised   Admission Diagnosis  Hypoxia [R09.02] Acute respiratory failure with hypoxia ([place]) [J96.01] Nonspecific chest pain [R07.9] New onset a-fib ([place]) [I48.91]   Discharge Diagnosis  Hypoxia [R09.02] Acute respiratory failure with hypoxia ([place]) [J96.01] Nonspecific chest pain [R07.9] New onset a-fib ([place]) [I48.91]    Principal Problem:   New onset a-fib ([place]) Active Problems:   Essential hypertension   Stage 3a chronic kidney disease ([place])   DM (diabetes mellitus) ([place])   Acute respiratory failure with hypoxia ([place])   Nonspecific chest pain   Dementia ([place])      Past Medical History:  Diagnosis Date   Diabetes mellitus without complication ([place])    Hypertension    Neuropathy due to type 2 diabetes mellitus ([place])     Past Surgical History:  Procedure Laterality Date   CATARACT EXTRACTION       HPI  from the history and physical done on the day of admission:    Chief Complaint: Chest pain, difficulty breathing   HPI: Annette Fitzgerald is a 87 y.o. female with medical history significant for hypertension, diabetes mellitus. Patient was brought to the ED by ED EMS reports of sudden chest  pain and difficulty breathing.  On my evaluation Daughter Annette Fitzgerald is at bedside, and helps with history.  Patient does not remember details of earlier today.  Patient's other daughter Annette Fitzgerald had just finished giving patient a bath, when suddenly patient cluctched her chest and said she was having difficulty breathing. At the time of my evaluation, patient is calm, not in any distress whatsoever.  She denies any current chest pain or difficulty breathing and does not remember having any of these symptoms earlier. Family at bedside reports patient has been doing well.  No fevers no chills no cough.  No complaints of pain whatsoever.    EMS reports that patient's O2 sats were 85% on room air, she was placed on 4 L.   ED Course: Temperature 99.5.  Pulse rate 80s to 106.  Respiratory rate 16-26.  Blood pressure systolic [id number].  On my evaluation, O2 sats 95 - 100% on room air, at rest.  WBC 12.2.  Troponin 5 x2.  BNP 88.  Chest x-ray central vascular congestion without overt pulmonary edema.  CTA-no evidence of PE.  Cardiomegaly coronary artery disease, agrees emphysema, bibasilar atelectasis.  EKG shows old left bundle branch block. Initial plan was to discharge patient home from the ED, but O2 sats dropped to 79% with ambulation.  Patient was placed back on 2 L.     Hospital Course:    Brief Narrative:  87 y.o. female with medical history significant for hypertension, diabetes mellitus admitted on 05/20/2021 with dyspnea and acute hypoxic respiratory failure with O2 sats of 85% on room air O2 sats improved to 95% on 4 L of oxygen via nasal cannula -On 05/21/2021 patient went to A. fib with RVR with heart rate above 150 received IV Cardizem bolus remained tachycardic transferred to stepdown unit for continuous IV Cardizem drip -Patient converted back to sinus rhythm on 05/23/2021 -Had episodes of paroxysmal A. fib overnight -She is back in sinus rhythm at this time please see discharge EKG on  05/24/2021   Problem  New Onset A-Fib ([facility])  DM (Diabetes Mellitus) ([facility])  Essential Hypertension  Stage 3a chronic kidney disease ([facility])  Acute Respiratory Failure With Hypoxia ([facility])  Nonspecific Chest Pain   A/p 1)New onset Afib with RVR- --A. fib with RVR with heart rate above 150  -Initially failed IV Cardizem, however was placed on IV amiodarone overnight converted back to SR  -Had bradycardia with Cardizem after she converted to sinus rhythm -Also had bradycardia with metoprolol 25 mg twice daily -TSH 0.358 -Echo with EF of 50 to 55%, grade 1 diastolic dysfunction noted -No aortic stenosis or mitral stenosis, no atrial enlargement -Risk versus benefit of full anticoagulation discussed with patient and family members including her daughters Annette Fitzgerald and [place], they declined full anticoagulation at this time HASBLED score is 1  CHA2DS2- VASc score   is = at least 66  (age x2, gender x 1, Aortic Plaque x 1, HTN x 1, DM x 1)  Which is  equal to = 9.7 % annual risk of stroke  This patients CHA2DS2-VASc Score and unadjusted Ischemic Stroke Rate (% per year) is equal to 9.7 % stroke rate/year from a score of 6 -Aspirin as prescribed -Overnight patient had episode of paroxysmal atrial fibrillation again, -She is back in sinus rhythm at this time even with ambulation remains in sinus rhythm please see discharge EKG on 05/24/2021 -Tolerating Cardizem better today -??  Some component of tachybradycardia syndrome -Going forward if unable to tolerate Cardizem or metoprolol for rate control due to bradycardia I will recommend follow-up with cardiology to discuss possible pacemaker placement.   2) acute hypoxic respiratory failure--O2 sats was  85% on room air O2 sats improved to 95% on 4 L of oxygen via nasal cannula --Suspect hypoxia is due to due to CHF and A. fib with RVR in the setting of underlying Emphysema/COPD -Hypoxia resolved -Patient ambulated close to 100 feet O2 sats 91% on room air  heart rate in the mid 70s--condition on exertion, no chest pains with or post ambulation   3)COPD/ emphysema--- CTA chest findings noted  -patient and family deny significant tobacco use or exposure -No wheezing --avoid bronchodilators given A. fib with RVR -Treated with steroids   4)Chest  pains --- resolved, troponin 5>>5>>6 -EKG with persistent left bundle branch block  -Repeat EKG sinus rhythm -Chest pain-free even with ambulation   5)DM2-continue glipizide , -Hyperglycemia related to steroids anticipate improvement in glycemic control once patient comes off steroids   6)HTN--BP medications adjusted to give room to titrate Cardizem for rate control   7)Dementia ---  requires help with ADLs, -Continue Aricept and as needed Xanax   8)AKI----acute kidney injury - -Suspect possible ATN in the setting of cardiac arrhythmia with transient hypotension compounded by poor oral intake -Creatinine 1.66 >> 1.52 (Baseline 1.1 to 1.2) renally adjust medications, avoid nephrotoxic agents / dehydration  / hypotension -Repeat BMP as outpatient advised   9)Generalized weakness--- PT eval appreciated recommends home health PT  10) leukocytosis--- suspect partly steroid induced     Disposition--- Home with family and home health services   Disposition: The patient is from: Home              Anticipated d/c is to: Home                 Code Status :  -  Code Status: Full Code    Family Communication:   Discussed with daughter and grand daughters at bedside Consults  :  na  Discharge Condition: stable  Follow UP   Follow-up Information     [facility].   Specialty: Home Health Services Why: PT will call to schedule your first home visit. Contact information: [address] [place] [address] [phone]         Erma Heritage, PA-C. Schedule an appointment as soon as possible for a visit in 2 week(s).   Specialties: Physician  Assistant, Cardiology Why: for Atrial Fibrillation Contact information: [address] [place] [address] [phone]                 Diet and Activity recommendation:  As advised  Discharge Instructions    Discharge Instructions     Call MD for:  difficulty breathing, headache or visual disturbances   Complete by: As directed    Call MD for:  persistant dizziness or light-headedness   Complete by: As directed    Call MD for:  persistant nausea and vomiting   Complete by: As directed    Diet - low sodium heart healthy   Complete by: As directed    Diet Carb Modified   Complete by: As directed    Discharge instructions   Complete by: As directed    1)Please Note that there are several changes to your medications 2)follow up with Cardiologist as advised for atrial fibrillation (irregular Heart beats) management  3)Very low-salt diet advised 4)Weigh yourself daily, call if you gain more than 3 pounds in 1 day or more than 5 pounds in 1 week as your diuretic medications may need to be adjusted 5)Repeat CBC and BMP blood test with primary care physician in about a week or so advised   Increase activity slowly   Complete by: As directed         Discharge Medications     Allergies as of 05/24/2021   No Known Allergies      Medication List     STOP taking these medications    amLODipine 10 MG tablet Commonly known as: NORVASC   aspirin 81 MG chewable tablet Replaced by: aspirin EC 81 MG tablet   hydrALAZINE 25 MG tablet Commonly known as: APRESOLINE       TAKE these medications    Accu-Chek Aviva Plus test strip Generic drug: glucose blood   Accu-Chek Softclix Lancets lancets   acetaminophen 325 MG tablet Commonly known as: TYLENOL Take 2 tablets (650 mg total) by mouth every 6 (six) hours as needed for mild pain (or Fever >/= 101).   alendronate  70 MG tablet Commonly known as: FOSAMAX Take 70 mg by mouth once a week.   ALPRAZolam 1 MG  tablet Commonly known as: XANAX Take by mouth.   aspirin EC 81 MG tablet Take 1 tablet (81 mg total) by mouth daily with breakfast. Replaces: aspirin 81 MG chewable tablet   atorvastatin 10 MG tablet Commonly known as: LIPITOR Take by mouth.   B-D SINGLE USE SWABS REGULAR Pads   Basaglar KwikPen 100 UNIT/ML Inject into the skin.   BD Pen Needle Nano 2nd Gen 32G X 4 MM Misc Generic drug: Insulin Pen Needle   cholecalciferol 25 MCG (1000 UNIT) tablet Commonly known as: VITAMIN D Take by mouth.   diltiazem 120 MG 24 hr capsule Commonly known as: Cardizem CD Take 1 capsule (120 mg total) by mouth daily. To keep Heart from going too fast   donepezil 10 MG tablet Commonly known as: ARICEPT Take by mouth.   furosemide 20 MG tablet Commonly known as: Lasix Take 1 tablet (20 mg total) by mouth daily.   gabapentin 300 MG capsule Commonly known as: NEURONTIN Take 300 mg by mouth 2 (two) times daily.   glipiZIDE 10 MG 24 hr tablet Commonly known as: GLUCOTROL XL Take 10 mg by mouth daily with breakfast.   latanoprost 0.005 % ophthalmic solution Commonly known as: XALATAN   linagliptin 5 MG Tabs tablet Commonly known as: TRADJENTA Take 5 mg by mouth daily.   losartan 25 MG tablet Commonly known as: COZAAR Take 0.5 tablets (12.5 mg total) by mouth daily. What changed: Another medication with the same name was removed. Continue taking this medication, and follow the directions you see here.   Nu-Iron 150 MG capsule Generic drug: iron polysaccharides Take by mouth.   polyethylene glycol 17 g packet Commonly known as: MIRALAX / GLYCOLAX Take 17 g by mouth daily as needed for mild constipation.   potassium chloride 10 MEQ tablet Commonly known as: KLOR-CON Take 1 tablet (10 mEq total) by mouth daily. Take While taking Lasix/furosemide   predniSONE 20 MG tablet Commonly known as: DELTASONE Take 1 tablet (20 mg total) by mouth daily with breakfast. Start taking on:  May 25, 2021   timolol 0.25 % ophthalmic solution Commonly known as: TIMOPTIC PLACE 1 DROP INTO BOTH EYES BID        Major procedures and Radiology Reports - PLEASE review detailed and final reports for all details, in brief -    CT Angio Chest PE W/Cm &/Or Wo Cm  Result Date: 05/20/2021 CLINICAL DATA:  Chest pain, shortness of breath EXAM: CT ANGIOGRAPHY CHEST WITH CONTRAST TECHNIQUE: Multidetector CT imaging of the chest was performed using the standard protocol during bolus administration of intravenous contrast. Multiplanar CT image reconstructions and MIPs were obtained to evaluate the vascular anatomy. CONTRAST:  59mL OMNIPAQUE IOHEXOL 350 MG/ML SOLN COMPARISON:  None. FINDINGS: Cardiovascular: Cardiomegaly. Diffuse coronary artery calcifications and aortic calcifications. No aneurysm. No filling defects in the pulmonary arteries to suggest pulmonary emboli. Mediastinum/Nodes: No mediastinal, hilar, or axillary adenopathy. Trachea and esophagus are unremarkable. Thyroid unremarkable. Lungs/Pleura: Moderate emphysema. Bibasilar atelectasis. No confluent opacities or effusions. Upper Abdomen: Imaging into the upper abdomen demonstrates no acute findings. Musculoskeletal: Chest wall soft tissues are unremarkable. No acute bony abnormality. Review of the MIP images confirms the above findings. IMPRESSION: No evidence of pulmonary embolus. Cardiomegaly, coronary artery disease. Aortic Atherosclerosis (ICD10-I70.0) and Emphysema (ICD10-J43.9). Electronically Signed   By: Rolm Baptise M.D.   On: 05/20/2021 19:26  DG Chest Port 1 View  Result Date: 05/20/2021 CLINICAL DATA:  Chest pain and shortness of breath since this morning, hypoxia EXAM: PORTABLE CHEST 1 VIEW COMPARISON:  05/05/2005 FINDINGS: Single frontal view of the chest demonstrates an enlarged cardiac silhouette. Atherosclerosis of the aortic arch. Lung volumes are diminished, with central vascular congestion. No acute airspace  disease, effusion, or pneumothorax. IMPRESSION: 1. Low lung volumes. 2. Central vascular congestion without overt edema. Electronically Signed   By: Randa Ngo M.D.   On: 05/20/2021 17:15   ECHOCARDIOGRAM COMPLETE  Result Date: 05/21/2021    ECHOCARDIOGRAM REPORT   Patient Name:   Annette Fitzgerald Date of Exam: 05/21/2021 Medical Rec #:  [id number]        Height:       66.0 in Accession #:    [id number]       Weight:       155.0 lb Date of Birth:  02-11-1933        BSA:          1.794 m Patient Age:    43 years         BP:           103/44 mmHg Patient Gender: F                HR:           69 bpm. Exam Location:  [facility] Procedure: 2D Echo, Cardiac Doppler and Color Doppler Indications:    Chest Pain  History:        Patient has no prior history of Echocardiogram examinations.                 Risk Factors:Hypertension and Diabetes.  Sonographer:    Wenda Low Referring Phys: [place]  1. Left ventricular ejection fraction, by estimation, is 50 to 55%. The left ventricle has low normal function. The left ventricle has no regional wall motion abnormalities. There is moderate concentric left ventricular hypertrophy. Left ventricular diastolic parameters are consistent with Grade I diastolic dysfunction (impaired relaxation).  2. Right ventricular systolic function is normal. The right ventricular size is normal. There is mildly elevated pulmonary artery systolic pressure.  3. The mitral valve is normal in structure. Trivial mitral valve regurgitation.  4. The aortic valve is tricuspid. There is mild calcification of the aortic valve. There is mild thickening of the aortic valve. Aortic valve regurgitation is not visualized. Aortic valve sclerosis/calcification is present, without any evidence of aortic stenosis. Comparison(s): No prior Echocardiogram. FINDINGS  Left Ventricle: Left ventricular ejection fraction, by estimation, is 50 to 55%. The left ventricle has low  normal function. The left ventricle has no regional wall motion abnormalities. The left ventricular internal cavity size was normal in size. There is moderate concentric left ventricular hypertrophy. Abnormal (paradoxical) septal motion, consistent with left bundle branch block. Left ventricular diastolic parameters are consistent with Grade I diastolic dysfunction (impaired relaxation). Right Ventricle: The right ventricular size is normal. No increase in right ventricular wall thickness. Right ventricular systolic function is normal. There is mildly elevated pulmonary artery systolic pressure. The tricuspid regurgitant velocity is 2.92  m/s, and with an assumed right atrial pressure of 3 mmHg, the estimated right ventricular systolic pressure is [id number] mmHg. Left Atrium: Left atrial size was normal in size. Right Atrium: Right atrial size was normal in size. Pericardium: Trivial pericardial effusion is present. Mitral Valve: The mitral valve is normal in structure. There is mild  thickening of the mitral valve leaflet(s). There is mild calcification of the mitral valve leaflet(s). Mild mitral annular calcification. Trivial mitral valve regurgitation. MV peak gradient, 4.3 mmHg. The mean mitral valve gradient is 2.0 mmHg. Tricuspid Valve: The tricuspid valve is normal in structure. Tricuspid valve regurgitation is trivial. Aortic Valve: The aortic valve is tricuspid. There is mild calcification of the aortic valve. There is mild thickening of the aortic valve. Aortic valve regurgitation is not visualized. Aortic valve sclerosis/calcification is present, without any evidence of aortic stenosis. Aortic valve mean gradient measures 5.0 mmHg. Aortic valve peak gradient measures 8.8 mmHg. Aortic valve area, by VTI measures 2.13 cm. Pulmonic Valve: The pulmonic valve was normal in structure. Pulmonic valve regurgitation is trivial. Aorta: The aortic root and ascending aorta are structurally normal, with no evidence of  dilitation. Venous: The inferior vena cava was not well visualized. IAS/Shunts: No atrial level shunt detected by color flow Doppler.  LEFT VENTRICLE PLAX 2D LVIDd:         3.50 cm     Diastology LVIDs:         2.58 cm     LV e' medial:    6.96 cm/s LV PW:         1.30 cm     LV E/e' medial:  7.6 LV IVS:        1.30 cm     LV e' lateral:   7.07 cm/s LVOT diam:     2.00 cm     LV E/e' lateral: 7.5 LV SV:         73 LV SV Index:   41 LVOT Area:     3.14 cm  LV Volumes (MOD) LV vol d, MOD A2C: 74.1 ml LV vol d, MOD A4C: 90.9 ml LV vol s, MOD A2C: 35.3 ml LV vol s, MOD A4C: 43.4 ml LV SV MOD A2C:     38.8 ml LV SV MOD A4C:     90.9 ml LV SV MOD BP:      44.0 ml RIGHT VENTRICLE RV Basal diam:  3.90 cm RV Mid diam:    3.30 cm RV S prime:     12.00 cm/s TAPSE (M-mode): 2.2 cm LEFT ATRIUM             Index        RIGHT ATRIUM           Index LA diam:        3.60 cm 2.01 cm/m   RA Area:     15.00 cm LA Vol (A2C):   55.3 ml 30.82 ml/m  RA Volume:   38.50 ml  21.46 ml/m LA Vol (A4C):   41.3 ml 23.02 ml/m LA Biplane Vol: 48.5 ml 27.03 ml/m  AORTIC VALVE                    PULMONIC VALVE AV Area (Vmax):    2.67 cm     PV Vmax:       0.80 m/s AV Area (Vmean):   2.27 cm     PV Peak grad:  2.6 mmHg AV Area (VTI):     2.13 cm AV Vmax:           148.00 cm/s AV Vmean:          99.700 cm/s AV VTI:            0.344 m AV Peak Grad:      8.8 mmHg AV Mean  Grad:      5.0 mmHg LVOT Vmax:         126.00 cm/s LVOT Vmean:        72.000 cm/s LVOT VTI:          0.233 m LVOT/AV VTI ratio: 0.68  AORTA Ao Root diam: 2.80 cm Ao Asc diam:  2.90 cm MITRAL VALVE               TRICUSPID VALVE MV Area (PHT): 2.48 cm    TR Peak grad:   34.1 mmHg MV Area VTI:   2.40 cm    TR Vmax:        292.00 cm/s MV Peak grad:  4.3 mmHg MV Mean grad:  2.0 mmHg    SHUNTS MV Vmax:       1.04 m/s    Systemic VTI:  0.23 m MV Vmean:      58.3 cm/s   Systemic Diam: 2.00 cm MV Decel Time: 306 msec MV E velocity: 52.90 cm/s MV A velocity: 86.80 cm/s MV E/A ratio:   0.61 Gwyndolyn Kaufman MD Electronically signed by Gwyndolyn Kaufman MD Signature Date/Time: 05/21/2021/2:28:58 PM    Final     Micro Results   Recent Results (from the past 240 hour(s))  Resp Panel by RT-PCR (Flu A&B, Covid) Nasopharyngeal Swab     Status: None   Collection Time: 05/20/21  5:13 PM   Specimen: Nasopharyngeal Swab; Nasopharyngeal(NP) swabs in vial transport medium  Result Value Ref Range Status   SARS Coronavirus 2 by RT PCR NEGATIVE NEGATIVE Final    Comment: (NOTE) SARS-CoV-2 target nucleic acids are NOT DETECTED.  The SARS-CoV-2 RNA is generally detectable in upper respiratory specimens during the acute phase of infection. The lowest concentration of SARS-CoV-2 viral copies this assay can detect is 138 copies/mL. A negative result does not preclude SARS-Cov-2 infection and should not be used as the sole basis for treatment or other patient management decisions. A negative result may occur with  improper specimen collection/handling, submission of specimen other than nasopharyngeal swab, presence of viral mutation(s) within the areas targeted by this assay, and inadequate number of viral copies(<138 copies/mL). A negative result must be combined with clinical observations, patient history, and epidemiological information. The expected result is Negative.  Fact Sheet for Patients:  EntrepreneurPulse.com.au  Fact Sheet for Healthcare Providers:  IncredibleEmployment.be  This test is no t yet approved or cleared by the [place] FDA and  has been authorized for detection and/or diagnosis of SARS-CoV-2 by FDA under an Emergency Use Authorization (EUA). This EUA will remain  in effect (meaning this test can be used) for the duration of the COVID-19 declaration under Section 564(b)(1) of the Act, 21 U.S.C.section 360bbb-3(b)(1), unless the authorization is terminated  or revoked sooner.       Influenza A by PCR NEGATIVE  NEGATIVE Final   Influenza B by PCR NEGATIVE NEGATIVE Final    Comment: (NOTE) The Xpert Xpress SARS-CoV-2/FLU/RSV plus assay is intended as an aid in the diagnosis of influenza from Nasopharyngeal swab specimens and should not be used as a sole basis for treatment. Nasal washings and aspirates are unacceptable for Xpert Xpress SARS-CoV-2/FLU/RSV testing.  Fact Sheet for Patients: EntrepreneurPulse.com.au  Fact Sheet for Healthcare Providers: IncredibleEmployment.be  This test is not yet approved or cleared by the [place] FDA and has been authorized for detection and/or diagnosis of SARS-CoV-2 by FDA under an Emergency Use Authorization (EUA). This EUA will remain in effect (meaning this  test can be used) for the duration of the COVID-19 declaration under Section 564(b)(1) of the Act, 21 U.S.C. section 360bbb-3(b)(1), unless the authorization is terminated or revoked.  Performed at [facility], [address] [place], [place] [address]   MRSA Next Gen by PCR, Nasal     Status: None   Collection Time: 05/21/21  4:30 PM   Specimen: Nasal Mucosa; Nasal Swab  Result Value Ref Range Status   MRSA by PCR Next Gen NOT DETECTED NOT DETECTED Final    Comment: (NOTE) The GeneXpert MRSA Assay (FDA approved for NASAL specimens only), is one component of a comprehensive MRSA colonization surveillance program. It is not intended to diagnose MRSA infection nor to guide or monitor treatment for MRSA infections. Test performance is not FDA approved in patients less than 22 years old. Performed at [facility], [address] [place], [place] [address]    Today   Subjective    Keaja Fegley today has no new complaints   Daughter Annette Fitzgerald at bedside and Daughter [name] is on the phone -No fever  Or chills   No Nausea, Vomiting or Diarrhea -- No chest pains, no palpitations no dizziness Patient ambulated close to 100 feet O2 sats 91% on room air  heart rate in the mid 70s--condition on exertion, no chest pains with or post ambulation         Patient has been seen and examined prior to discharge   Objective   Blood pressure 118/72, pulse (!) 57, temperature 98.1 F (36.7 C), temperature source Oral, resp. rate 18, height 5\' 6"  (1.676 m), weight 68.9 kg, SpO2 91 %.   Intake/Output Summary (Last 24 hours) at 05/24/2021 1612 Last data filed at 05/24/2021 1200 Gross per 24 hour  Intake 460 ml  Output 2 ml  Net 458 ml    Exam Gen:- Awake Alert, no acute distress  HEENT:- [place].AT, No sclera icterus Neck-Supple Neck,No JVD,.  Lungs-  CTAB , good air movement bilaterally  CV- S1, S2 normal, irregular Abd-  +ve B.Sounds, Abd Soft, No tenderness,    Extremity/Skin:- No  edema,   good pulses affect is appropriate, baseline/underlying cognitive and memory deficits consistent with moderate dementia  neuro-generalized weakness, no new focal deficits, no tremors     Data Review   CBC w Diff:  Lab Results  Component Value Date   WBC 15.9 (H) 05/23/2021   HGB 11.2 (L) 05/23/2021   HCT 34.6 (L) 05/23/2021   PLT 306 05/23/2021   LYMPHOPCT 4 05/20/2021   MONOPCT 5 05/20/2021   EOSPCT 2 05/20/2021   BASOPCT 0 05/20/2021   CMP:  Lab Results  Component Value Date   NA 135 05/24/2021   K 4.1 05/24/2021   CL 101 05/24/2021   CO2 28 05/24/2021   BUN 64 (H) 05/24/2021   CREATININE 1.52 (H) 05/24/2021   PROT 8.6 (H) 05/20/2021   ALBUMIN 3.9 05/20/2021   BILITOT 0.9 05/20/2021   ALKPHOS 95 05/20/2021   AST 21 05/20/2021   ALT 16 05/20/2021   Total Discharge time is about 33 minutes  Roxan Hockey M.D on 05/24/2021 at 4:12 PM  Go to [email or web address] -  for contact info  [facility] - Office  [phone]

## 2021-05-28 DIAGNOSIS — E1151 Type 2 diabetes mellitus with diabetic peripheral angiopathy without gangrene: Secondary | ICD-10-CM | POA: Diagnosis not present

## 2021-05-28 DIAGNOSIS — N182 Chronic kidney disease, stage 2 (mild): Secondary | ICD-10-CM | POA: Diagnosis not present

## 2021-05-28 DIAGNOSIS — E1165 Type 2 diabetes mellitus with hyperglycemia: Secondary | ICD-10-CM | POA: Diagnosis not present

## 2021-05-28 DIAGNOSIS — I70234 Atherosclerosis of native arteries of right leg with ulceration of heel and midfoot: Secondary | ICD-10-CM | POA: Diagnosis not present

## 2021-05-28 DIAGNOSIS — I10 Essential (primary) hypertension: Secondary | ICD-10-CM | POA: Diagnosis not present

## 2021-05-28 DIAGNOSIS — I4891 Unspecified atrial fibrillation: Secondary | ICD-10-CM | POA: Diagnosis not present

## 2021-06-11 ENCOUNTER — Encounter: Payer: Self-pay | Admitting: Cardiology

## 2021-06-11 ENCOUNTER — Other Ambulatory Visit: Payer: Self-pay

## 2021-06-11 ENCOUNTER — Ambulatory Visit (INDEPENDENT_AMBULATORY_CARE_PROVIDER_SITE_OTHER): Payer: Medicare HMO

## 2021-06-11 ENCOUNTER — Ambulatory Visit (INDEPENDENT_AMBULATORY_CARE_PROVIDER_SITE_OTHER): Payer: Medicare HMO | Admitting: Cardiology

## 2021-06-11 VITALS — BP 126/64 | HR 59 | Ht 63.0 in | Wt 144.0 lb

## 2021-06-11 DIAGNOSIS — I5032 Chronic diastolic (congestive) heart failure: Secondary | ICD-10-CM | POA: Diagnosis not present

## 2021-06-11 DIAGNOSIS — I480 Paroxysmal atrial fibrillation: Secondary | ICD-10-CM | POA: Diagnosis not present

## 2021-06-11 DIAGNOSIS — I10 Essential (primary) hypertension: Secondary | ICD-10-CM | POA: Diagnosis not present

## 2021-06-11 DIAGNOSIS — N179 Acute kidney failure, unspecified: Secondary | ICD-10-CM

## 2021-06-11 DIAGNOSIS — J449 Chronic obstructive pulmonary disease, unspecified: Secondary | ICD-10-CM | POA: Diagnosis not present

## 2021-06-11 DIAGNOSIS — I4891 Unspecified atrial fibrillation: Secondary | ICD-10-CM

## 2021-06-11 MED ORDER — APIXABAN 2.5 MG PO TABS: 2.5000 mg | ORAL_TABLET | Freq: Two times a day (BID) | ORAL | 2 refills | Status: DC

## 2021-06-11 MED ORDER — FUROSEMIDE 20 MG PO TABS: 20.0000 mg | ORAL_TABLET | Freq: Every day | ORAL | 1 refills | Status: DC | PRN

## 2021-06-11 NOTE — Patient Instructions (Addendum)
Medication Instructions:  STOP Diltiazem  STOP Aspirin START Eliquis 2.5 mg twice daily  TAKE Lasix 20 mg daily as needed (if weight gain of 3 lbs in a day, or 5 lbs in a week)  *If you need a refill on your cardiac medications before your next appointment, please call your pharmacy*   Lab Work: BMET, CBC today   If you have labs (blood work) drawn today and your tests are completely normal, you will receive your results only by: MyChart Message (if you have MyChart) OR A paper copy in the mail If you have any lab test that is abnormal or we need to change your treatment, we will call you to review the results.  Testing:  ZIO XT- Long Term Monitor Instructions  Your physician has requested you wear a ZIO patch monitor for 14 days.  This is a single patch monitor. [employer] supplies one patch monitor per enrollment. Additional stickers are not available. Please do not apply patch if you will be having a Nuclear Stress Test,  Echocardiogram, Cardiac CT, MRI, or Chest Xray during the period you would be wearing the  monitor. The patch cannot be worn during these tests. You cannot remove and re-apply the  ZIO XT patch monitor.  Your ZIO patch monitor will be mailed 3 day USPS to your address on file. It may take 3-5 days  to receive your monitor after you have been enrolled.  Once you have received your monitor, please review the enclosed instructions. Your monitor  has already been registered assigning a specific monitor serial # to you.  Billing and Patient Assistance Program Information  We have supplied [employer] with any of your insurance information on file for billing purposes. [employer] offers a sliding scale Patient Assistance Program for patients that do not have  insurance, or whose insurance does not completely cover the cost of the ZIO monitor.  You must apply for the Patient Assistance Program to qualify for this discounted rate.  To apply, please call [employer] at  [phone], select option 4, select option 2, ask to apply for  Patient Assistance Program. Meredeth Ide will ask your household income, and how many people  are in your household. They will quote your out-of-pocket cost based on that information.  [employer] will also be able to set up a 25-month, interest-free payment plan if needed.  Applying the monitor   Shave hair from upper left chest.  Hold abrader disc by orange tab. Rub abrader in 40 strokes over the upper left chest as  indicated in your monitor instructions.  Clean area with 4 enclosed alcohol pads. Let dry.  Apply patch as indicated in monitor instructions. Patch will be placed under collarbone on left  side of chest with arrow pointing upward.  Rub patch adhesive wings for 2 minutes. Remove white label marked "1". Remove the white  label marked "2". Rub patch adhesive wings for 2 additional minutes.  While looking in a mirror, press and release button in center of patch. A small green light will  flash 3-4 times. This will be your only indicator that the monitor has been turned on.  Do not shower for the first 24 hours. You may shower after the first 24 hours.  Press the button if you feel a symptom. You will hear a small click. Record Date, Time and  Symptom in the Patient Logbook.  When you are ready to remove the patch, follow instructions on the last 2 pages of Patient  Logbook.  Stick patch monitor onto the last page of Patient Logbook.  Place Patient Logbook in the blue and white box. Use locking tab on box and tape box closed  securely. The blue and white box has prepaid postage on it. Please place it in the mailbox as  soon as possible. Your physician should have your test results approximately 7 days after the  monitor has been mailed back to [facility]  Call [facility] Customer Care at [phone] if you have questions regarding  your ZIO XT patch monitor. Call them immediately if you see an orange light  blinking on your  monitor.  If your monitor falls off in less than 4 days, contact our Monitor department at [phone].  If your monitor becomes loose or falls off after 4 days call [employer] at [phone] for  suggestions on securing your monitor    Follow-Up: At [facility], you and your health needs are our priority.  As part of our continuing mission to provide you with exceptional heart care, we have created designated Provider Care Teams.  These Care Teams include your primary Cardiologist (physician) and Advanced Practice Providers (APPs -  Physician Assistants and Nurse Practitioners) who all work together to provide you with the care you need, when you need it.  We recommend signing up for the patient portal called "MyChart".  Sign up information is provided on this After Visit Summary.  MyChart is used to connect with patients for Virtual Visits (Telemedicine).  Patients are able to view lab/test results, encounter notes, upcoming appointments, etc.  Non-urgent messages can be sent to your provider as well.   To learn more about what you can do with MyChart, go to [email or web address].    Your next appointment:   March 9th at 11:00 AM  The format for your next appointment:   In Person  Provider:   Epifanio Lesches, MD

## 2021-06-11 NOTE — Progress Notes (Signed)
Cardiology Office Note:    Date:  06/11/2021   ID:  Annette Fitzgerald, DOB [birth year], MRN [id number]  PCP:  Rosita Fire, MD  Cardiologist:  None  Electrophysiologist:  None   Referring MD: Rosita Fire, MD   Chief Complaint  Patient presents with   New Patient (Initial Visit)   Atrial Fibrillation    History of Present Illness:    Annette Fitzgerald is a 88 y.o. female with a hx of dementia, paroxysmal atrial fibrillation, COPD, T2DM, hypertension who presents for evaluation of atrial fibrillation.  She was admitted from 12/13 through 05/24/2021.  She presented with chest pain and shortness of breath.  On arrival to the ED, found to have SPO2 down to 85% on room air.  Troponins negative.  Found on admission to have new onset atrial fibrillation, heart rates up to 150s.  Noted to be bradycardic with metoprolol and Cardizem.  Was discharged on diltiazem 120 mg daily.  Echo showed EF 50 to [id number], grade 1 diastolic dysfunction.  Anticoagulation was discussed with patient and her daughters but they declined.  Since discharge from the hospital, she denies any chest pain, dyspnea, lower extremity edema, or palpitations.  Her daughter reports that she was unable to tolerate diltiazem.  States that she was unsteady on her feet and had a fall while on diltiazem.  Symptoms have occurred since stopping.  Otherwise denies any falls.  Reports heart rate was as low as 40s at home.  She denies any history of bleeding issues.    Past Medical History:  Diagnosis Date   Diabetes mellitus without complication ([place])    Hypertension    Neuropathy due to type 2 diabetes mellitus ([place])     Past Surgical History:  Procedure Laterality Date   CATARACT EXTRACTION      Current Medications: Current Meds  Medication Sig   ACCU-CHEK AVIVA PLUS test strip    ACCU-CHEK SOFTCLIX LANCETS lancets    acetaminophen (TYLENOL) 325 MG tablet Take 2 tablets (650 mg total) by mouth every 6 (six) hours as needed for  mild pain (or Fever >/= 101).   Alcohol Swabs (B-D SINGLE USE SWABS REGULAR) PADS    alendronate (FOSAMAX) 70 MG tablet Take 70 mg by mouth once a week.    ALPRAZolam (XANAX) 1 MG tablet Take by mouth.   apixaban (ELIQUIS) 2.5 MG TABS tablet Take 1 tablet (2.5 mg total) by mouth 2 (two) times daily.   atorvastatin (LIPITOR) 10 MG tablet Take by mouth.   BD PEN NEEDLE NANO 2ND GEN 32G X 4 MM MISC    cholecalciferol (VITAMIN D) 25 MCG (1000 UT) tablet Take by mouth.   donepezil (ARICEPT) 10 MG tablet Take by mouth.   gabapentin (NEURONTIN) 300 MG capsule Take 300 mg by mouth 2 (two) times daily.    glipiZIDE (GLUCOTROL XL) 10 MG 24 hr tablet Take 10 mg by mouth daily with breakfast.   Insulin Glargine (BASAGLAR KWIKPEN) 100 UNIT/ML Inject into the skin.   iron polysaccharides (NIFEREX) 150 MG capsule Take by mouth.   latanoprost (XALATAN) 0.005 % ophthalmic solution    linagliptin (TRADJENTA) 5 MG TABS tablet Take 5 mg by mouth daily.   losartan (COZAAR) 25 MG tablet Take 0.5 tablets (12.5 mg total) by mouth daily.   polyethylene glycol (MIRALAX / GLYCOLAX) 17 g packet Take 17 g by mouth daily as needed for mild constipation.   potassium chloride (KLOR-CON) 10 MEQ tablet Take 1 tablet (10 mEq total) by  mouth daily. Take While taking Lasix/furosemide   predniSONE (DELTASONE) 20 MG tablet Take 1 tablet (20 mg total) by mouth daily with breakfast.   timolol (TIMOPTIC) 0.25 % ophthalmic solution PLACE 1 DROP INTO BOTH EYES BID   [DISCONTINUED] aspirin EC 81 MG tablet Take 1 tablet (81 mg total) by mouth daily with breakfast.   [DISCONTINUED] diltiazem (CARDIZEM CD) 120 MG 24 hr capsule Take 1 capsule (120 mg total) by mouth daily. To keep Heart from going too fast   [DISCONTINUED] furosemide (LASIX) 20 MG tablet Take 1 tablet (20 mg total) by mouth daily.     Allergies:   Patient has no known allergies.   Social History   Socioeconomic History   Marital status: Married    Spouse name: Not on  file   Number of children: Not on file   Years of education: Not on file   Highest education level: Not on file  Occupational History   Not on file  Tobacco Use   Smoking status: Never   Smokeless tobacco: Never  Substance and Sexual Activity   Alcohol use: No   Drug use: No   Sexual activity: Not on file  Other Topics Concern   Not on file  Social History Narrative   Not on file   Social Determinants of Health   Financial Resource Strain: Not on file  Food Insecurity: Not on file  Transportation Needs: Not on file  Physical Activity: Not on file  Stress: Not on file  Social Connections: Not on file     Family History: The patient's family history is not on file.  ROS:   Please see the history of present illness.     All other systems reviewed and are negative.  EKGs/Labs/Other Studies Reviewed:    The following studies were reviewed today:   EKG:   06/11/21: Normal sinus rhythm, rate 62, left bundle branch block  Recent Labs: 05/20/2021: ALT 16; B Natriuretic Peptide 88.0; TSH 0.358 05/23/2021: Hemoglobin 11.2; Platelets 306 05/24/2021: BUN 64; Creatinine, Ser 1.52; Potassium 4.1; Sodium 135  Recent Lipid Panel No results found for: CHOL, TRIG, HDL, CHOLHDL, VLDL, LDLCALC, LDLDIRECT  Physical Exam:    VS:  BP 126/64 (BP Location: Left Arm, Patient Position: Sitting, Cuff Size: Normal)    Pulse (!) 59    Ht 5\' 3"  (1.6 m)    Wt 144 lb (65.3 kg)    BMI 25.51 kg/m     Wt Readings from Last 3 Encounters:  06/11/21 144 lb (65.3 kg)  05/23/21 151 lb 14.4 oz (68.9 kg)  04/26/16 155 lb (70.3 kg)     GEN:  Well nourished, well developed in no acute distress HEENT: Normal NECK: No JVD; No carotid bruits LYMPHATICS: No lymphadenopathy CARDIAC: RRR, no murmurs, rubs, gallops RESPIRATORY:  Clear to auscultation without rales, wheezing or rhonchi  ABDOMEN: Soft, non-tender, non-distended MUSCULOSKELETAL:  No edema; No deformity  SKIN: Warm and dry NEUROLOGIC:   Alert and oriented x 2, did not know year PSYCHIATRIC:  Normal affect   ASSESSMENT:    1. Paroxysmal atrial fibrillation (HCC)   2. Chronic diastolic heart failure ([place])   3. Essential hypertension   4. AKI (acute kidney injury) ([place])    PLAN:    Paroxysmal atrial fibrillation: Diagnosed during admission in 05/2021.  CHA2DS2-VASc score 5 (hypertension, age x2, DM, female).  Echocardiogram on 05/21/2021 showed EF 50 to 55%, moderate LVH, grade 1 diastolic dysfunction, normal RV function, no significant valve disease -Unable  to tolerate metoprolol or diltiazem due to bradycardia.  Will check Zio patch x2 weeks to evaluate A. fib burden.  If having issues with A. fib with RVR, will plan referral to EP for tachybrady syndrome -Previously declined anticoagulation.  Discussed risks and benefits.  She had recent fall that daughter attributes to taking diltiazem.  Otherwise does not have history of falls or bleeding issues.  They are agreeable with starting Eliquis.  Will start 2.5 mg twice daily given age greater than 80 and creatinine greater than 1.5.  Will stop aspirin  Chronic diastolic heart failure: Discharged from hospital 05/2021 on Lasix 20 mg daily.  Has not been taking.  Recommend monitoring daily weights and take Lasix 20 mg as needed if gains more than 3 pounds 1 day or 5 pounds 1 week  Hypertension: Appears controlled, on losartan  AKI on CKD: Creatinine 1.52 on discharge on 05/24/2021.  She follows with nephrology.  We will check BMET    RTC in 2 months   Medication Adjustments/Labs and Tests Ordered: Current medicines are reviewed at length with the patient today.  Concerns regarding medicines are outlined above.  Orders Placed This Encounter  Procedures   Basic metabolic panel   CBC   LONG TERM MONITOR (3-14 DAYS)   EKG 12-Lead   Meds ordered this encounter  Medications   apixaban (ELIQUIS) 2.5 MG TABS tablet    Sig: Take 1 tablet (2.5 mg total) by mouth 2 (two) times  daily.    Dispense:  60 tablet    Refill:  2   furosemide (LASIX) 20 MG tablet    Sig: Take 1 tablet (20 mg total) by mouth daily as needed.    Dispense:  30 tablet    Refill:  1    Patient Instructions  Medication Instructions:  STOP Diltiazem  STOP Aspirin START Eliquis 2.5 mg twice daily  TAKE Lasix 20 mg daily as needed (if weight gain of 3 lbs in a day, or 5 lbs in a week)  *If you need a refill on your cardiac medications before your next appointment, please call your pharmacy*   Lab Work: BMET, CBC today   If you have labs (blood work) drawn today and your tests are completely normal, you will receive your results only by: [place] (if you have MyChart) OR A paper copy in the mail If you have any lab test that is abnormal or we need to change your treatment, we will call you to review the results.  Testing:  ZIO XT- Long Term Monitor Instructions  Your physician has requested you wear a ZIO patch monitor for 14 days.  This is a single patch monitor. [employer] supplies one patch monitor per enrollment. Additional stickers are not available. Please do not apply patch if you will be having a Nuclear Stress Test,  Echocardiogram, Cardiac CT, MRI, or Chest Xray during the period you would be wearing the  monitor. The patch cannot be worn during these tests. You cannot remove and re-apply the  ZIO XT patch monitor.  Your ZIO patch monitor will be mailed 3 day USPS to your address on file. It may take 3-5 days  to receive your monitor after you have been enrolled.  Once you have received your monitor, please review the enclosed instructions. Your monitor  has already been registered assigning a specific monitor serial # to you.  Billing and Patient Assistance Program Information  We have supplied [employer] with any of your insurance information  on file for billing purposes. [employer] offers a sliding scale Patient Assistance Program for patients that do not have   insurance, or whose insurance does not completely cover the cost of the ZIO monitor.  You must apply for the Patient Assistance Program to qualify for this discounted rate.  To apply, please call [employer] at [phone], select option 4, select option 2, ask to apply for  Patient Assistance Program. Theodore Demark will ask your household income, and how many people  are in your household. They will quote your out-of-pocket cost based on that information.  [employer] will also be able to set up a 70-month, interest-free payment plan if needed.  Applying the monitor   Shave hair from upper left chest.  Hold abrader disc by orange tab. Rub abrader in 40 strokes over the upper left chest as  indicated in your monitor instructions.  Clean area with 4 enclosed alcohol pads. Let dry.  Apply patch as indicated in monitor instructions. Patch will be placed under collarbone on left  side of chest with arrow pointing upward.  Rub patch adhesive wings for 2 minutes. Remove white label marked "1". Remove the white  label marked "2". Rub patch adhesive wings for 2 additional minutes.  While looking in a mirror, press and release button in center of patch. A small green light will  flash 3-4 times. This will be your only indicator that the monitor has been turned on.  Do not shower for the first 24 hours. You may shower after the first 24 hours.  Press the button if you feel a symptom. You will hear a small click. Record Date, Time and  Symptom in the Patient Logbook.  When you are ready to remove the patch, follow instructions on the last 2 pages of Patient  Logbook. Stick patch monitor onto the last page of Patient Logbook.  Place Patient Logbook in the blue and white box. Use locking tab on box and tape box closed  securely. The blue and white box has prepaid postage on it. Please place it in the mailbox as  soon as possible. Your physician should have your test results approximately 7 days after the  monitor  has been mailed back to [facility].  Call [place] at [phone] if you have questions regarding  your ZIO XT patch monitor. Call them immediately if you see an orange light blinking on your  monitor.  If your monitor falls off in less than 4 days, contact our Monitor department at [phone].  If your monitor becomes loose or falls off after 4 days call [employer] at [phone] for  suggestions on securing your monitor    Follow-Up: At [facility], you and your health needs are our priority.  As part of our continuing mission to provide you with exceptional heart care, we have created designated Provider Care Teams.  These Care Teams include your primary Cardiologist (physician) and Advanced Practice Providers (APPs -  Physician Assistants and Nurse Practitioners) who all work together to provide you with the care you need, when you need it.  We recommend signing up for the patient portal called "MyChart".  Sign up information is provided on this After Visit Summary.  MyChart is used to connect with patients for Virtual Visits (Telemedicine).  Patients are able to view lab/test results, encounter notes, upcoming appointments, etc.  Non-urgent messages can be sent to your provider as well.   To learn more about what you can do with MyChart, go to  [email or web address].    Your next appointment:   March 9th at 11:00 AM  The format for your next appointment:   In Person  Provider:   Oswaldo Milian, MD    Signed, Donato Heinz, MD  06/11/2021 3:53 PM    [place]

## 2021-06-11 NOTE — Progress Notes (Unsigned)
Enrolled for [employer] to mail a ZIO XT long term holter monitor to the patients address on file.  

## 2021-06-12 ENCOUNTER — Ambulatory Visit: Payer: Medicare HMO | Admitting: Cardiology

## 2021-06-12 ENCOUNTER — Other Ambulatory Visit: Payer: Self-pay

## 2021-06-12 DIAGNOSIS — Z79899 Other long term (current) drug therapy: Secondary | ICD-10-CM

## 2021-06-12 LAB — BASIC METABOLIC PANEL
BUN/Creatinine Ratio: 13 (ref 12–28)
BUN: 17 mg/dL (ref 8–27)
CO2: 25 mmol/L (ref 20–29)
Calcium: 9.4 mg/dL (ref 8.7–10.3)
Chloride: 102 mmol/L (ref 96–106)
Creatinine, Ser: 1.34 mg/dL — ABNORMAL HIGH (ref 0.57–1.00)
Glucose: 154 mg/dL — ABNORMAL HIGH (ref 70–99)
Potassium: 5.5 mmol/L — ABNORMAL HIGH (ref 3.5–5.2)
Sodium: 140 mmol/L (ref 134–144)
eGFR: 39 mL/min/{1.73_m2} — ABNORMAL LOW (ref >59)

## 2021-06-12 LAB — CBC
Hematocrit: 33 % — ABNORMAL LOW (ref 34.0–46.6)
Hemoglobin: 11.3 g/dL (ref 11.1–15.9)
MCH: 30.2 pg (ref 26.6–33.0)
MCHC: 34.2 g/dL (ref 31.5–35.7)
MCV: 88 fL (ref 79–97)
Platelets: 270 10*3/uL (ref 150–450)
RBC: 3.74 x10E6/uL — ABNORMAL LOW (ref 3.77–5.28)
RDW: 13.1 % (ref 11.7–15.4)
WBC: 4.7 10*3/uL (ref 3.4–10.8)

## 2021-06-14 DIAGNOSIS — I4891 Unspecified atrial fibrillation: Secondary | ICD-10-CM | POA: Diagnosis not present

## 2021-06-18 ENCOUNTER — Other Ambulatory Visit: Payer: Self-pay | Admitting: Cardiology

## 2021-06-18 ENCOUNTER — Telehealth: Payer: Self-pay | Admitting: *Deleted

## 2021-06-18 ENCOUNTER — Telehealth: Payer: Self-pay | Admitting: Cardiology

## 2021-06-18 NOTE — Telephone Encounter (Signed)
Patient's daughter called stating her mother won't leave the monitor on.  This is the second one that was sent, with the state of her mind she just takes them off.  Her daughter wants to know if she should just sent this back.

## 2021-06-18 NOTE — Telephone Encounter (Signed)
Hello,  This email is to notify you that the patient listed below had an issue where the he removed it in the first 24 hours of wear. We have placed an order for a replacement device to be overnighted to the patient. We have placed a hold on the billing, so the patient is not charged for the first device. If you have any questions, please respond to this email, or call us at [phone].   Patient initials:  M.B  Patient ID: [id number]  SN: [id number]  Ticket: [id number]   Account: [place]   Thank you,  [place]

## 2021-06-18 NOTE — Telephone Encounter (Signed)
Called pt's daughter. She states they sent another monitor to the pt yesterday. She kept it on yesterday and at some point today she took the monitor off. Her daughter said she can not keep the monitor on. She states they tried putting mittens and also putting a halter to keep it in place. "Some how she got the mitten off and got the monitor off." She is going to sent the monitor back, the pt wore the monitor less than a day each time. Will forward message to Dr. Bjorn Pippin.

## 2021-06-19 ENCOUNTER — Other Ambulatory Visit: Payer: Self-pay | Admitting: *Deleted

## 2021-06-19 DIAGNOSIS — Z79899 Other long term (current) drug therapy: Secondary | ICD-10-CM

## 2021-06-24 DIAGNOSIS — I4891 Unspecified atrial fibrillation: Secondary | ICD-10-CM | POA: Diagnosis not present

## 2021-06-26 LAB — BASIC METABOLIC PANEL

## 2021-06-27 NOTE — Addendum Note (Signed)
Addended by: Bea Laura B on: 06/27/2021 11:01 AM   Modules accepted: Orders

## 2021-06-28 DIAGNOSIS — E1142 Type 2 diabetes mellitus with diabetic polyneuropathy: Secondary | ICD-10-CM | POA: Diagnosis not present

## 2021-06-28 DIAGNOSIS — I10 Essential (primary) hypertension: Secondary | ICD-10-CM | POA: Diagnosis not present

## 2021-07-01 ENCOUNTER — Other Ambulatory Visit: Payer: Self-pay

## 2021-07-01 DIAGNOSIS — Z79899 Other long term (current) drug therapy: Secondary | ICD-10-CM | POA: Diagnosis not present

## 2021-07-02 ENCOUNTER — Encounter: Payer: Self-pay | Admitting: *Deleted

## 2021-07-02 LAB — BASIC METABOLIC PANEL
BUN/Creatinine Ratio: 15 (ref 12–28)
BUN: 16 mg/dL (ref 8–27)
CO2: 22 mmol/L (ref 20–29)
Calcium: 9.6 mg/dL (ref 8.7–10.3)
Chloride: 99 mmol/L (ref 96–106)
Creatinine, Ser: 1.06 mg/dL — ABNORMAL HIGH (ref 0.57–1.00)
Glucose: 166 mg/dL — ABNORMAL HIGH (ref 70–99)
Potassium: 4.7 mmol/L (ref 3.5–5.2)
Sodium: 140 mmol/L (ref 134–144)
eGFR: 51 mL/min/{1.73_m2} — ABNORMAL LOW (ref >59)

## 2021-07-08 ENCOUNTER — Ambulatory Visit: Payer: Medicare HMO | Admitting: Podiatry

## 2021-07-08 ENCOUNTER — Ambulatory Visit: Payer: Medicare HMO | Admitting: Cardiovascular Disease

## 2021-07-08 ENCOUNTER — Other Ambulatory Visit: Payer: Self-pay

## 2021-07-08 ENCOUNTER — Encounter: Payer: Self-pay | Admitting: Podiatry

## 2021-07-08 DIAGNOSIS — M2042 Other hammer toe(s) (acquired), left foot: Secondary | ICD-10-CM

## 2021-07-08 DIAGNOSIS — M79674 Pain in right toe(s): Secondary | ICD-10-CM

## 2021-07-08 DIAGNOSIS — M79675 Pain in left toe(s): Secondary | ICD-10-CM

## 2021-07-08 DIAGNOSIS — M2012 Hallux valgus (acquired), left foot: Secondary | ICD-10-CM | POA: Diagnosis not present

## 2021-07-08 DIAGNOSIS — E119 Type 2 diabetes mellitus without complications: Secondary | ICD-10-CM

## 2021-07-08 DIAGNOSIS — E1142 Type 2 diabetes mellitus with diabetic polyneuropathy: Secondary | ICD-10-CM | POA: Diagnosis not present

## 2021-07-08 DIAGNOSIS — B351 Tinea unguium: Secondary | ICD-10-CM

## 2021-07-08 DIAGNOSIS — M2011 Hallux valgus (acquired), right foot: Secondary | ICD-10-CM

## 2021-07-08 DIAGNOSIS — M2041 Other hammer toe(s) (acquired), right foot: Secondary | ICD-10-CM | POA: Diagnosis not present

## 2021-07-13 NOTE — Progress Notes (Signed)
ANNUAL DIABETIC FOOT EXAM  Subjective: Annette Fitzgerald presents today for for annual diabetic foot examination.  Patient relates dx of diabetes.  Patient denies any h/o foot wounds.  Patient has been diagnosed with neuropathy and it is managed with gsbapentin.  Risk factors: uncontrolled diabetes, diabetic neuropathy, CKD, HTN.  Carrolyn Meiers, MD is patient's PCP. Last visit was 02/07/2021.  Past Medical History:  Diagnosis Date   Diabetes mellitus without complication ([place])    Hypertension    Neuropathy due to type 2 diabetes mellitus [facility])    Patient Active Problem List   Diagnosis Date Noted   New onset a-fib ([place]) 05/21/2021   Acute respiratory failure with hypoxia ([place]) 05/20/2021   Nonspecific chest pain 05/20/2021   DM (diabetes mellitus) ([place]) 05/20/2021   Dementia ([place]) 05/20/2021   Essential hypertension 10/13/2019   Stage 3a chronic kidney disease ([place]) 10/13/2019   Type II or unspecified type diabetes mellitus with renal manifestations, uncontrolled 10/13/2019   Past Surgical History:  Procedure Laterality Date   CATARACT EXTRACTION     Current Outpatient Medications on File Prior to Visit  Medication Sig Dispense Refill   ACCU-CHEK AVIVA PLUS test strip      ACCU-CHEK SOFTCLIX LANCETS lancets      acetaminophen (TYLENOL) 325 MG tablet Take 2 tablets (650 mg total) by mouth every 6 (six) hours as needed for mild pain (or Fever >/= 101). 12 tablet 0   Alcohol Swabs (B-D SINGLE USE SWABS REGULAR) PADS      alendronate (FOSAMAX) 70 MG tablet Take 70 mg by mouth once a week.      ALPRAZolam (XANAX) 1 MG tablet Take by mouth.     apixaban (ELIQUIS) 2.5 MG TABS tablet Take 1 tablet (2.5 mg total) by mouth 2 (two) times daily. 60 tablet 2   atorvastatin (LIPITOR) 10 MG tablet Take by mouth.     BD PEN NEEDLE NANO 2ND GEN 32G X 4 MM MISC      cholecalciferol (VITAMIN D) 25 MCG (1000 UT) tablet Take by mouth.     donepezil (ARICEPT) 10 MG tablet Take by  mouth.     furosemide (LASIX) 20 MG tablet Take 1 tablet (20 mg total) by mouth daily as needed. 30 tablet 1   gabapentin (NEURONTIN) 300 MG capsule Take 300 mg by mouth 2 (two) times daily.      glipiZIDE (GLUCOTROL XL) 10 MG 24 hr tablet Take 10 mg by mouth daily with breakfast.     Insulin Glargine (BASAGLAR KWIKPEN) 100 UNIT/ML Inject into the skin.     iron polysaccharides (NIFEREX) 150 MG capsule Take by mouth.     latanoprost (XALATAN) 0.005 % ophthalmic solution      linagliptin (TRADJENTA) 5 MG TABS tablet Take 5 mg by mouth daily.     losartan (COZAAR) 25 MG tablet Take 0.5 tablets (12.5 mg total) by mouth daily. 45 tablet 0   polyethylene glycol (MIRALAX / GLYCOLAX) 17 g packet Take 17 g by mouth daily as needed for mild constipation. 14 each 0   potassium chloride (KLOR-CON) 10 MEQ tablet Take 1 tablet (10 mEq total) by mouth daily. Take While taking Lasix/furosemide 30 tablet 2   predniSONE (DELTASONE) 20 MG tablet Take 1 tablet (20 mg total) by mouth daily with breakfast. 5 tablet 0   timolol (TIMOPTIC) 0.25 % ophthalmic solution PLACE 1 DROP INTO BOTH EYES BID     No current facility-administered medications on file prior to visit.  No Known Allergies Social History   Occupational History   Not on file  Tobacco Use   Smoking status: Never   Smokeless tobacco: Never  Substance and Sexual Activity   Alcohol use: No   Drug use: No   Sexual activity: Not on file   History reviewed. No pertinent family history.  There is no immunization history on file for this patient.   Review of Systems: Negative except as noted in the HPI.   Objective: There were no vitals filed for this visit.  Annette Fitzgerald is a pleasant 88 y.o. female in NAD. AAO X 3.  Vascular Examination: CFT <3 seconds b/l LE. Palpable DP/PT pulses b/l LE. Digital hair absent b/l. Skin temperature gradient WNL b/l. No pain with calf compression b/l. No edema noted b/l. No cyanosis or clubbing noted  b/l LE.  Dermatological Examination: Pedal integument with normal turgor, texture and tone b/l LE. No open wounds b/l. No interdigital macerations b/l. Toenails 1-5 b/l elongated, thickened, discolored with subungual debris. +Tenderness with dorsal palpation of nailplates. No hyperkeratotic or porokeratotic lesions present.  Musculoskeletal Examination: Normal muscle strength 5/5 to all lower extremity muscle groups bilaterally. HAV with bunion deformity noted b/l LE. Hammertoe deformity noted 2-5 b/l.Marland Kitchen No pain, crepitus or joint limitation noted with ROM b/l LE.  Patient ambulates independently without assistive aids.  Footwear Assessment: Does the patient wear appropriate shoes? Yes. Does the patient need inserts/orthotics? Yes.  Neurological Examination: Pt has subjective symptoms of neuropathy. Protective sensation intact 5/5 intact bilaterally with 10g monofilament b/l. Vibratory sensation intact b/l.  Hemoglobin A1C Latest Ref Rng & Units 05/20/2021  HGBA1C 4.8 - 5.6 % 8.6(H)  Some recent data might be hidden   Assessment: 1. Pain due to onychomycosis of toenails of both feet   2. Hallux valgus, acquired, bilateral   3. Acquired hammertoes of both feet   4. Diabetic peripheral neuropathy associated with type 2 diabetes mellitus ([place])   5. Encounter for diabetic foot exam ([place])      ADA Risk Categorization: Low Risk :  Patient has all of the following: Intact protective sensation No prior foot ulcer  No severe deformity Pedal pulses present  Plan: -Diabetic foot examination performed today. -Continue foot and shoe inspections daily. Monitor blood glucose per PCP/Endocrinologist's recommendations. -Mycotic toenails 1-5 bilaterally were debrided in length and girth with sterile nail nippers and dremel without incident. -Patient/POA to call should there be question/concern in the interim.  Return in about 3 months (around 10/05/2021) for diabetic nail trim.  Marzetta Board, DPM

## 2021-07-29 DIAGNOSIS — I10 Essential (primary) hypertension: Secondary | ICD-10-CM | POA: Diagnosis not present

## 2021-07-29 DIAGNOSIS — E1142 Type 2 diabetes mellitus with diabetic polyneuropathy: Secondary | ICD-10-CM | POA: Diagnosis not present

## 2021-08-04 ENCOUNTER — Ambulatory Visit: Payer: Medicare HMO | Admitting: Internal Medicine

## 2021-08-10 NOTE — Progress Notes (Signed)
Cardiology Office Note:    Date:  08/14/2021   ID:  Annette Fitzgerald, DOB September 28, 1932, MRN [id number]  PCP:  Carrolyn Meiers, MD  Cardiologist:  Donato Heinz, MD  Electrophysiologist:  None   Referring MD: Carrolyn Meiers*   Chief Complaint  Patient presents with   Atrial Fibrillation    History of Present Illness:    Annette Fitzgerald is a 88 y.o. female with a hx of dementia, paroxysmal atrial fibrillation, COPD, T2DM, hypertension who presents f follow-up.  She was admitted from 12/13 through 05/24/2021.  She presented with chest pain and shortness of breath.  On arrival to the ED, found to have SPO2 down to 85% on room air.  Troponins negative.  Found on admission to have new onset atrial fibrillation, heart rates up to 150s.  Noted to be bradycardic with metoprolol and Cardizem.  Was discharged on diltiazem 120 mg daily.  Echo showed EF 50 to [id number], grade 1 diastolic dysfunction.  Anticoagulation was discussed with patient and her daughters but they declined.  At clinic appointment on 06/11/2021, were in agreement with starting Eliquis.  Zio patch in 06/2021 was only worn for 1 day, no significant arrhythmias seen.  Since last clinic visit, she reports that has been doing okay.  Daughter states that she has had some balance issues recently but no falls. Denies any chest pain, dyspnea, lightheadedness, syncope, lower extremity edema, or palpitations.  Stop taking losartan, has not been checking BP at home regularly.  Reports weight has been stable.  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   Alcohol Swabs (B-D SINGLE USE SWABS REGULAR) PADS    alendronate (FOSAMAX) 70 MG tablet Take 70 mg by mouth once a week.    ALPRAZolam (XANAX) 1 MG tablet Take by mouth.   apixaban (ELIQUIS) 2.5 MG TABS tablet Take 1 tablet (2.5 mg total) by mouth 2 (two) times daily.   BD PEN NEEDLE NANO 2ND GEN 32G X 4 MM MISC    cholecalciferol (VITAMIN D) 25 MCG (1000 UT) tablet Take by mouth.   donepezil (ARICEPT) 10 MG tablet Take by mouth.   gabapentin (NEURONTIN) 300 MG capsule Take 300 mg by mouth 2 (two) times daily.    Insulin Glargine (BASAGLAR KWIKPEN) 100 UNIT/ML Inject into the skin.   latanoprost (XALATAN) 0.005 % ophthalmic solution    linagliptin (TRADJENTA) 5 MG TABS tablet Take 5 mg by mouth daily.   polyethylene glycol (MIRALAX / GLYCOLAX) 17 g packet Take 17 g by mouth daily as needed for mild constipation.   timolol (TIMOPTIC) 0.25 % ophthalmic solution PLACE 1 DROP INTO BOTH EYES BID     Allergies:   Patient has no known allergies.   Social History   Socioeconomic History  Marital status: Married    Spouse name: Not on file   Number of children: Not on file   Years of education: Not on file   Highest education level: Not on file  Occupational History   Not on file  Tobacco Use   Smoking status: Never   Smokeless tobacco: Never  Substance and Sexual Activity   Alcohol use: No   Drug use: No   Sexual activity: Not on file  Other Topics Concern   Not on file  Social History Narrative   Not on file   Social Determinants of Health   Financial Resource Strain: Not on file  Food Insecurity: Not on file  Transportation Needs: Not on file  Physical Activity: Not on file  Stress: Not on file  Social Connections: Not on file     Family  History: The patient's family history is not on file.  ROS:   Please see the history of present illness.     All other systems reviewed and are negative.  EKGs/Labs/Other Studies Reviewed:    The following studies were reviewed today:   EKG:   08/14/21: Sinus bradycardia, rate 59, left bundle branch block 06/11/21: Normal sinus rhythm, rate 62, left bundle branch block  Recent Labs: 05/20/2021: ALT 16; B Natriuretic Peptide 88.0; TSH 0.358 06/11/2021: Hemoglobin 11.3; Platelets 270 07/01/2021: BUN 16; Creatinine, Ser 1.06; Potassium 4.7; Sodium 140  Recent Lipid Panel No results found for: CHOL, TRIG, HDL, CHOLHDL, VLDL, LDLCALC, LDLDIRECT  Physical Exam:    VS:  BP (!) 174/92    Pulse (!) 59    Ht 5\' 3"  (1.6 m)    Wt 143 lb 6.4 oz (65 kg)    SpO2 97%    BMI 25.40 kg/m     Wt Readings from Last 3 Encounters:  08/14/21 143 lb 6.4 oz (65 kg)  06/11/21 144 lb (65.3 kg)  05/23/21 151 lb 14.4 oz (68.9 kg)     GEN:  Well nourished, well developed in no acute distress HEENT: Normal NECK: No JVD; No carotid bruits LYMPHATICS: No lymphadenopathy CARDIAC: RRR, no murmurs, rubs, gallops RESPIRATORY:  Clear to auscultation without rales, wheezing or rhonchi  ABDOMEN: Soft, non-tender, non-distended MUSCULOSKELETAL:  No edema; No deformity  SKIN: Warm and dry NEUROLOGIC:  Alert and oriented x 2, did not know year PSYCHIATRIC:  Normal affect   ASSESSMENT:    1. Paroxysmal atrial fibrillation (HCC)   2. Chronic diastolic heart failure ([place])   3. Essential hypertension   4. Medication management     PLAN:    Paroxysmal atrial fibrillation: Diagnosed during admission in 05/2021.  CHA2DS2-VASc score 5 (hypertension, age x2, DM, female).  Echocardiogram on 05/21/2021 showed EF 50 to 55%, moderate LVH, grade 1 diastolic dysfunction, normal RV function, no significant valve disease -Unable to tolerate metoprolol or diltiazem due to bradycardia.  Ordered Zio patch x2 weeks to evaluate A.  fib burden but was only able to wear for 1 day, no significant arrhythmias seen.  If having issues with A. fib with RVR, will plan referral to EP for tachybrady syndrome -Discussed risks and benefits of anticoagulation and at clinic appointment in January 2023 started 2.5 mg twice daily given age greater than 80 and creatinine greater than 1.5 (we will monitor, recent creatinine was less than 1.5, may need to increase Eliquis dose to 5 mg twice daily if creatinine consistently less than 1.5).    Chronic diastolic heart failure: Discharged from hospital 05/2021 on Lasix 20  mg daily.  Has not been taking.  Recommend monitoring daily weights and take Lasix 20 mg as needed if gains more than 3 pounds 1 day or 5 pounds 1 week  Hypertension: Previously was controlled on losartan but she stopped taking.  We will restart losartan 12.5 mg daily.  Check BMET in 1 week.  Asked to check BP daily for next 2 weeks and call with results.  Hyperlipidemia: LDL 87 on 04/23/20, on atorvastatin 10 mg daily  T2DM: on glipizide, insulin.  A1c 8.6% on 05/10/21  AKI on CKD: Creatinine 1.52 on discharge on 05/24/2021.  Most recent BMET on 07/01/2021 showed creatinine had improved to 1.06    RTC in 3 months   Medication Adjustments/Labs and Tests Ordered: Current medicines are reviewed at length with the patient today.  Concerns regarding medicines are outlined above.  Orders Placed This Encounter  Procedures   Basic metabolic panel   EKG [id number]   Meds ordered this encounter  Medications   losartan (COZAAR) 25 MG tablet    Sig: Take 0.5 tablets (12.5 mg total) by mouth daily.    Dispense:  45 tablet    Refill:  3    Patient Instructions  Medication Instructions:  RESTART Losartan 12.5 mg (1/2 tablet) daily  Please check your blood pressure at home daily, write it down.  Call the office or send message via Mychart with the readings in 2 weeks for Dr. Gardiner Rhyme to review.   *If you need a refill on your  cardiac medications before your next appointment, please call your pharmacy*  Lab Work: Please return for labs in 1 week (BMET)-ok to have this completed at [facility] in [place]: At [facility], you and your health needs are our priority.  As part of our continuing mission to provide you with exceptional heart care, we have created designated Provider Care Teams.  These Care Teams include your primary Cardiologist (physician) and Advanced Practice Providers (APPs -  Physician Assistants and Nurse Practitioners) who all work together to provide you with the care you need, when you need it.  We recommend signing up for the patient portal called "MyChart".  Sign up information is provided on this After Visit Summary.  MyChart is used to connect with patients for Virtual Visits (Telemedicine).  Patients are able to view lab/test results, encounter notes, upcoming appointments, etc.  Non-urgent messages can be sent to your provider as well.   To learn more about what you can do with MyChart, go to [email or web address].    Your next appointment:   3 month(s) with NP or PA 6 months with Dr. Gardiner Rhyme      Signed, Donato Heinz, MD  08/14/2021 8:34 PM    [place]

## 2021-08-14 ENCOUNTER — Ambulatory Visit: Payer: Medicare HMO | Admitting: Cardiology

## 2021-08-14 ENCOUNTER — Other Ambulatory Visit: Payer: Self-pay

## 2021-08-14 ENCOUNTER — Encounter: Payer: Self-pay | Admitting: Cardiology

## 2021-08-14 VITALS — BP 174/92 | HR 59 | Ht 63.0 in | Wt 143.4 lb

## 2021-08-14 DIAGNOSIS — Z79899 Other long term (current) drug therapy: Secondary | ICD-10-CM

## 2021-08-14 DIAGNOSIS — I480 Paroxysmal atrial fibrillation: Secondary | ICD-10-CM | POA: Diagnosis not present

## 2021-08-14 DIAGNOSIS — J449 Chronic obstructive pulmonary disease, unspecified: Secondary | ICD-10-CM | POA: Diagnosis not present

## 2021-08-14 DIAGNOSIS — I5032 Chronic diastolic (congestive) heart failure: Secondary | ICD-10-CM

## 2021-08-14 DIAGNOSIS — I10 Essential (primary) hypertension: Secondary | ICD-10-CM

## 2021-08-14 MED ORDER — LOSARTAN POTASSIUM 25 MG PO TABS: 12.5000 mg | ORAL_TABLET | Freq: Every day | ORAL | 3 refills | Status: DC

## 2021-08-14 NOTE — Patient Instructions (Signed)
Medication Instructions:  ?RESTART Losartan 12.5 mg (1/2 tablet) daily ? ?Please check your blood pressure at home daily, write it down.  Call the office or send message via Mychart with the readings in 2 weeks for Dr. Bjorn Pippin to review.  ? ?*If you need a refill on your cardiac medications before your next appointment, please call your pharmacy* ? ?Lab Work: ?Please return for labs in 1 week (BMET)-ok to have this completed at [facility] in [place] ? ?Follow-Up: ?At [facility], you and your health needs are our priority.  As part of our continuing mission to provide you with exceptional heart care, we have created designated Provider Care Teams.  These Care Teams include your primary Cardiologist (physician) and Advanced Practice Providers (APPs -  Physician Assistants and Nurse Practitioners) who all work together to provide you with the care you need, when you need it. ? ?We recommend signing up for the patient portal called "MyChart".  Sign up information is provided on this After Visit Summary.  MyChart is used to connect with patients for Virtual Visits (Telemedicine).  Patients are able to view lab/test results, encounter notes, upcoming appointments, etc.  Non-urgent messages can be sent to your provider as well.   ?To learn more about what you can do with MyChart, go to [email or web address].   ? ?Your next appointment:   ?3 month(s) with NP or PA ?6 months with Dr. Bjorn Pippin ? ? ? ?

## 2021-08-21 DIAGNOSIS — R809 Proteinuria, unspecified: Secondary | ICD-10-CM | POA: Diagnosis not present

## 2021-08-21 DIAGNOSIS — E1129 Type 2 diabetes mellitus with other diabetic kidney complication: Secondary | ICD-10-CM | POA: Diagnosis not present

## 2021-08-21 DIAGNOSIS — I5032 Chronic diastolic (congestive) heart failure: Secondary | ICD-10-CM | POA: Diagnosis not present

## 2021-08-21 DIAGNOSIS — I480 Paroxysmal atrial fibrillation: Secondary | ICD-10-CM | POA: Diagnosis not present

## 2021-08-21 DIAGNOSIS — I10 Essential (primary) hypertension: Secondary | ICD-10-CM | POA: Diagnosis not present

## 2021-08-21 DIAGNOSIS — N189 Chronic kidney disease, unspecified: Secondary | ICD-10-CM | POA: Diagnosis not present

## 2021-08-21 DIAGNOSIS — Z79899 Other long term (current) drug therapy: Secondary | ICD-10-CM | POA: Diagnosis not present

## 2021-08-21 DIAGNOSIS — E559 Vitamin D deficiency, unspecified: Secondary | ICD-10-CM | POA: Diagnosis not present

## 2021-08-21 DIAGNOSIS — I129 Hypertensive chronic kidney disease with stage 1 through stage 4 chronic kidney disease, or unspecified chronic kidney disease: Secondary | ICD-10-CM | POA: Diagnosis not present

## 2021-08-21 DIAGNOSIS — E1122 Type 2 diabetes mellitus with diabetic chronic kidney disease: Secondary | ICD-10-CM | POA: Diagnosis not present

## 2021-08-22 ENCOUNTER — Encounter: Payer: Self-pay | Admitting: *Deleted

## 2021-08-22 LAB — BASIC METABOLIC PANEL
BUN/Creatinine Ratio: 17 (ref 12–28)
BUN: 21 mg/dL (ref 8–27)
CO2: 29 mmol/L (ref 20–29)
Calcium: 9.7 mg/dL (ref 8.7–10.3)
Chloride: 101 mmol/L (ref 96–106)
Creatinine, Ser: 1.21 mg/dL — ABNORMAL HIGH (ref 0.57–1.00)
Glucose: 159 mg/dL — ABNORMAL HIGH (ref 70–99)
Potassium: 4.1 mmol/L (ref 3.5–5.2)
Sodium: 140 mmol/L (ref 134–144)
eGFR: 44 mL/min/{1.73_m2} — ABNORMAL LOW (ref >59)

## 2021-08-26 DIAGNOSIS — E1142 Type 2 diabetes mellitus with diabetic polyneuropathy: Secondary | ICD-10-CM | POA: Diagnosis not present

## 2021-08-26 DIAGNOSIS — I10 Essential (primary) hypertension: Secondary | ICD-10-CM | POA: Diagnosis not present

## 2021-09-06 DIAGNOSIS — E1129 Type 2 diabetes mellitus with other diabetic kidney complication: Secondary | ICD-10-CM | POA: Diagnosis not present

## 2021-09-06 DIAGNOSIS — N189 Chronic kidney disease, unspecified: Secondary | ICD-10-CM | POA: Diagnosis not present

## 2021-09-06 DIAGNOSIS — E1122 Type 2 diabetes mellitus with diabetic chronic kidney disease: Secondary | ICD-10-CM | POA: Diagnosis not present

## 2021-09-06 DIAGNOSIS — E559 Vitamin D deficiency, unspecified: Secondary | ICD-10-CM | POA: Diagnosis not present

## 2021-09-06 DIAGNOSIS — I480 Paroxysmal atrial fibrillation: Secondary | ICD-10-CM | POA: Diagnosis not present

## 2021-09-06 DIAGNOSIS — N1832 Chronic kidney disease, stage 3b: Secondary | ICD-10-CM | POA: Diagnosis not present

## 2021-09-06 DIAGNOSIS — R809 Proteinuria, unspecified: Secondary | ICD-10-CM | POA: Diagnosis not present

## 2021-09-06 DIAGNOSIS — I129 Hypertensive chronic kidney disease with stage 1 through stage 4 chronic kidney disease, or unspecified chronic kidney disease: Secondary | ICD-10-CM | POA: Diagnosis not present

## 2021-09-15 ENCOUNTER — Telehealth: Payer: Self-pay | Admitting: Cardiology

## 2021-09-15 MED ORDER — APIXABAN 2.5 MG PO TABS: 2.5000 mg | ORAL_TABLET | Freq: Two times a day (BID) | ORAL | 2 refills | Status: DC

## 2021-09-15 NOTE — Telephone Encounter (Signed)
Looks like nephrology added chlorthalidone 12.5mg  qod and increased losartan to 25mg  on 4/1. Looks like BP is improving. Would not make any changes at this time. ?She is supposed to follow up with nephrology in 2 months. ?Would continue to monitor ?LVM on daughter phone to call back. ?

## 2021-09-15 NOTE — Telephone Encounter (Signed)
Prescription refill request for Eliquis received. ?Indication:Afib ?Last office visit:3/23 ?Scr:1.2 ?Age: 88 ?Weight:65 kg ? ?Prescription refilled ? ?

## 2021-09-15 NOTE — Telephone Encounter (Signed)
Patient was to resume losartan 12.5mg  at last visit on 08/14/21 ? ?Spoke with patient's daughter and was notified that nephrologist (Bhutani, Manpreet S, MBBS) told her to increase losartan to whole tablet (25mg )  ? ?Advised will send message to MD/HTN clinic team to review ? ?She would like any med changes to be communicated with nephrologist  ? ? ? ? ? ?

## 2021-09-15 NOTE — Telephone Encounter (Signed)
?*  STAT* If patient is at the pharmacy, call can be transferred to refill team. ? ? ?1. Which medications need to be refilled? (please list name of each medication and dose if known) apixaban (ELIQUIS) 2.5 MG TABS tablet ? ?2. Which pharmacy/location (including street and city if local pharmacy) is medication to be sent to? [id number] - [place], [address] ? ?3. Do they need a 30 day or 90 day supply? 90 day ?

## 2021-09-15 NOTE — Telephone Encounter (Signed)
Patient's daughter calling with BP readings: ? ? ?Starting Monday 20th: ?130/78 ?140/88 ?130/66 ?142/74 ?139/57 ?136/69 ?154/82 ?138/88 ?164/77 ?150/72 ?170/77 ?128/89 ?140/67 ?119/44 ? ? ?

## 2021-09-19 NOTE — Telephone Encounter (Signed)
Spoke with patient's daughter, advised to continue to check BP at home and bring with her to nephrology f/u. No changes needed. ?

## 2021-09-26 DIAGNOSIS — E1142 Type 2 diabetes mellitus with diabetic polyneuropathy: Secondary | ICD-10-CM | POA: Diagnosis not present

## 2021-09-26 DIAGNOSIS — I10 Essential (primary) hypertension: Secondary | ICD-10-CM | POA: Diagnosis not present

## 2021-10-07 ENCOUNTER — Ambulatory Visit (INDEPENDENT_AMBULATORY_CARE_PROVIDER_SITE_OTHER): Payer: Medicare HMO | Admitting: Podiatry

## 2021-10-07 ENCOUNTER — Encounter: Payer: Self-pay | Admitting: Podiatry

## 2021-10-07 DIAGNOSIS — E1142 Type 2 diabetes mellitus with diabetic polyneuropathy: Secondary | ICD-10-CM | POA: Diagnosis not present

## 2021-10-07 DIAGNOSIS — M79675 Pain in left toe(s): Secondary | ICD-10-CM

## 2021-10-07 DIAGNOSIS — M79674 Pain in right toe(s): Secondary | ICD-10-CM

## 2021-10-07 DIAGNOSIS — B351 Tinea unguium: Secondary | ICD-10-CM | POA: Diagnosis not present

## 2021-10-16 NOTE — Progress Notes (Signed)
?  Subjective:  ?[name], female    DOB: Aug 25, 1932,  MRN: [id number] ? ?Annette Fitzgerald presents to clinic today for at risk foot care with history of diabetic neuropathy and painful elongated mycotic toenails 1-5 bilaterally which are tender when wearing enclosed shoe gear. Pain is relieved with periodic professional debridement. ? ?Her daughter is present during today's visit. Patient is having some sharp pains on her back. States skin is very sensitive. ? ?Last known HgA1c was unknown. Patient did not check blood glucose today. ? ?New problem(s): None.  ? ?PCP is Carrolyn Meiers, MD , and last visit was June 28, 2021. ? ?No Known Allergies ? ?Review of Systems: Negative except as noted in the HPI. ? ?Objective: No changes noted in today's physical examination. ?There were no vitals filed for this visit. ? ?Annette Fitzgerald is a pleasant 88 y.o. female in NAD. AAO X 3. ? ?Vascular Examination: ?CFT <3 seconds b/l LE. Palpable DP/PT pulses b/l LE. Digital hair absent b/l. Skin temperature gradient WNL b/l. No pain with calf compression b/l. No edema noted b/l. No cyanosis or clubbing noted b/l LE. ? ?Dermatological Examination: ?Pedal integument with normal turgor, texture and tone b/l LE. No open wounds b/l. No interdigital macerations b/l. Toenails 1-5 b/l elongated, thickened, discolored with subungual debris. +Tenderness with dorsal palpation of nailplates. No hyperkeratotic or porokeratotic lesions present. ? ?Musculoskeletal Examination: ?Normal muscle strength 5/5 to all lower extremity muscle groups bilaterally. HAV with bunion deformity noted b/l LE. Hammertoe deformity noted 2-5 b/l.Marland Kitchen No pain, crepitus or joint limitation noted with ROM b/l LE.  Patient ambulates independently without assistive aids. ? ?Neurological Examination: ?Pt has subjective symptoms of neuropathy. Protective sensation intact 5/5 intact bilaterally with 10g monofilament b/l. Vibratory sensation intact  b/l. ? ?  Latest Ref Rng & Units 05/20/2021  ?  5:13 PM  ?Hemoglobin A1C  ?Hemoglobin-A1c 4.8 - 5.6 % 8.6    ? ?Assessment/Plan: ?1. Pain due to onychomycosis of toenails of both feet   ?2. Diabetic peripheral neuropathy associated with type 2 diabetes mellitus ([place])   ? ?-Patient was evaluated and treated. All patient's and/or POA's questions/concerns answered on today's visit. ?-Continue diabetic foot care principles: inspect feet daily, monitor glucose as recommended by PCP and/or Endocrinologist, and follow prescribed diet per PCP, Endocrinologist and/or dietician. ?-Patient to continue soft, supportive shoe gear daily. ?-Toenails 1-5 b/l were debrided in length and girth with sterile nail nippers and dremel without iatrogenic bleeding.  ?-Patient/POA to call should there be question/concern in the interim.  ? ?Return in about 3 months (around 01/07/2022). ? ?Marzetta Board, DPM  ?

## 2021-10-27 DIAGNOSIS — I10 Essential (primary) hypertension: Secondary | ICD-10-CM | POA: Diagnosis not present

## 2021-10-27 DIAGNOSIS — N182 Chronic kidney disease, stage 2 (mild): Secondary | ICD-10-CM | POA: Diagnosis not present

## 2021-10-27 DIAGNOSIS — G301 Alzheimer's disease with late onset: Secondary | ICD-10-CM | POA: Diagnosis not present

## 2021-10-27 DIAGNOSIS — E1142 Type 2 diabetes mellitus with diabetic polyneuropathy: Secondary | ICD-10-CM | POA: Diagnosis not present

## 2021-11-05 NOTE — Progress Notes (Unsigned)
Office Visit    Patient Name: Annette Fitzgerald Date of Encounter: 11/06/2021  Primary Care Provider:  Carrolyn Meiers, MD Primary Cardiologist:  Donato Heinz, MD  Chief Complaint    88 year old female with a history of paroxysmal atrial fibrillation, chronic diastolic heart failure, hypertension, hyperlipidemia, CKD, COPD, type 2 diabetes, and dementia who presents for follow-up related to atrial fibrillation and hypertension.  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   He was hospitalized in December 2022 with chest pain and shortness of breath.  Upon arrival to the ED, she was found to have O2 saturation 85% on room air.  Troponin was negative.  She was noted to be in new onset atrial fibrillation with RVR.  He had subsequent bradycardia with metoprolol and diltiazem.  Echocardiogram at the time showed EF 50 to 55%, G1 DD, normal RV function, no significant valvular disease. She was discharged on diltiazem 120 mg daily.  She declined anticoagulation at the time.  At her follow-up visit in January 2023 she agreed to begin Eliquis therapy.  Diltiazem was subsequently discontinued due to bradycardia.  Outpatient cardiac monitor (Zio patch) in January 2023 was worn for 1 day, no showed no significant arrhythmias.  She was last seen in the office on 08/14/2021 and was stable overall from a cardiac standpoint.  She did report some balance issues, but denied any falls. She had stopped taking her  losartan at the time.  It was noted that if she were to have recurrent atrial fibrillation she would likely require EP referral given tachybrady pattern.  Additionally, she has a history of CKD, and follows with nephrology.  Chlorthalidone was recently added to her BP medication regimen, additionally, her losartan was decreased.   She presents today for follow-up accompanied by her daughter.  Since her last visit he has been stable from a cardiac standpoint.  She denies any palpitations, dyspnea, edema, PND, orthopnea, denies symptoms concerning for angina.  She does note ongoing issues with balance, she denies falls. Overall, she reports feeling well and denies any new concerns today.  Home Medications    Current Outpatient Medications  Medication Sig Dispense Refill   ACCU-CHEK AVIVA PLUS test strip      ACCU-CHEK SOFTCLIX LANCETS lancets      Alcohol Swabs (B-D SINGLE USE SWABS REGULAR) PADS      alendronate (FOSAMAX) 70 MG tablet Take 70 mg by mouth once a week.      ALPRAZolam (XANAX) 1 MG tablet Take by mouth.     apixaban (ELIQUIS) 2.5 MG TABS tablet Take 1 tablet (2.5 mg total) by mouth 2 (two) times daily. 60 tablet 2   atorvastatin (LIPITOR) 10 MG tablet Take by mouth.     BD PEN NEEDLE NANO 2ND GEN 32G X 4 MM MISC      cholecalciferol (VITAMIN D) 25 MCG (1000 UT) tablet Take by mouth.     donepezil (ARICEPT) 10 MG tablet Take by mouth.     gabapentin (  NEURONTIN) 300 MG capsule Take 300 mg by mouth 2 (two) times daily.     glipiZIDE (GLUCOTROL XL) 10 MG 24 hr tablet Take 10 mg by mouth daily with breakfast.     Insulin Glargine (BASAGLAR KWIKPEN) 100 UNIT/ML Inject into the skin.     latanoprost (XALATAN) 0.005 % ophthalmic solution 1 drop at bedtime.     linagliptin (TRADJENTA) 5 MG TABS tablet Take 5 mg by mouth daily.     timolol (TIMOPTIC) 0.25 % ophthalmic solution PLACE 1 DROP INTO BOTH EYES BID     acetaminophen (TYLENOL) 325 MG tablet Take 2 tablets (650 mg total) by mouth  every 6 (six) hours as needed for mild pain (or Fever >/= 101). (Patient not taking: Reported on 11/06/2021) 12 tablet 0   chlorthalidone (HYGROTON) 25 MG tablet Take 12.5 mg by mouth every other day. (Patient not taking: Reported on 11/06/2021)     diltiazem (CARDIZEM CD) 120 MG 24 hr capsule Take 120 mg by mouth daily. (Patient not taking: Reported on 11/06/2021)     losartan (COZAAR) 25 MG tablet Take 25 mg by mouth daily. (Patient not taking: Reported on 11/06/2021)     losartan (COZAAR) 50 MG tablet Take by mouth. (Patient not taking: Reported on 11/06/2021)     polyethylene glycol (MIRALAX / GLYCOLAX) 17 g packet Take 17 g by mouth daily as needed for mild constipation. (Patient not taking: Reported on 11/06/2021) 14 each 0   No current facility-administered medications for this visit.     Review of Systems    She denies chest pain, palpitations, dyspnea, pnd, orthopnea, n, v, dizziness, syncope, edema, weight gain, or early satiety. All other systems reviewed and are otherwise negative except as noted above.   Physical Exam    VS:  BP 120/72   Pulse 60   Ht 5\' 3"  (1.6 m)   Wt 144 lb 9.6 oz (65.6 kg)   SpO2 95%   BMI 25.61 kg/m   GEN: Well nourished, well developed, in no acute distress. HEENT: normal. Neck: Supple, no JVD, carotid bruits, or masses. Cardiac: RRR, no murmurs, rubs, or gallops. No clubbing, cyanosis, edema.  Radials/DP/PT 2+ and equal bilaterally.  Respiratory:  Respirations regular and unlabored, clear to auscultation bilaterally. GI: Soft, nontender, nondistended, BS + x 4. MS: no deformity or atrophy. Skin: warm and dry, no rash. Neuro:  Strength and sensation are intact. Psych: Normal affect.  Accessory Clinical Findings    ECG personally reviewed by me today - No EKG in office today.  Lab Results  Component Value Date   WBC 4.7 06/11/2021   HGB 11.3 06/11/2021   HCT 33.0 (L) 06/11/2021   MCV 88 06/11/2021   PLT 270 06/11/2021   Lab Results  Component  Value Date   CREATININE 1.21 (H) 08/21/2021   BUN 21 08/21/2021   NA 140 08/21/2021   K 4.1 08/21/2021   CL 101 08/21/2021   CO2 29 08/21/2021   Lab Results  Component Value Date   ALT 16 05/20/2021   AST 21 05/20/2021   ALKPHOS 95 05/20/2021   BILITOT 0.9 05/20/2021   No results found for: CHOL, HDL, LDLCALC, LDLDIRECT, TRIG, CHOLHDL  Lab Results  Component Value Date   HGBA1C 8.6 (H) 05/20/2021    Assessment & Plan   1. Paroxysmal atrial fibrillation: Diagnosed in December 2022. Outpatient cardiac monitor (Zio patch) in January 2023 was worn for 1 day,  showed no significant arrhythmias. No documented recurrence.  RRR on auscultation today. Denies bleeding on Eliquis. Continue dose-reduced Eliquis.   2. Chronic diastolic heart failure:  Echo in 05/2021 showed EF 50 to 55%, G1 DD, normal RV function, no significant valvular disease. Euvolemic and well compensated on exam.  Continue losartan.  3. Hypertension: BP well controlled. Continue current antihypertensive regimen.   4. Hyperlipidemia: No recent LDL on file. Monitored and managed per PCP. Continue Lipitor.  5. CKD: Creatinine was 1.23 in March 2023.  Follows with nephrology.  6. Type 2 diabetes: A1c was 8.6 in December 2022.  Monitored and managed per PCP.  7. Disposition: Follow-up as scheduled with Dr. Gardiner Rhyme in September 2023.  Lenna Sciara, NP 11/06/2021, 4:21 PM

## 2021-11-06 ENCOUNTER — Ambulatory Visit (INDEPENDENT_AMBULATORY_CARE_PROVIDER_SITE_OTHER): Payer: Medicare HMO | Admitting: Nurse Practitioner

## 2021-11-06 ENCOUNTER — Encounter: Payer: Self-pay | Admitting: Nurse Practitioner

## 2021-11-06 VITALS — BP 120/72 | HR 60 | Ht 63.0 in | Wt 144.6 lb

## 2021-11-06 DIAGNOSIS — E782 Mixed hyperlipidemia: Secondary | ICD-10-CM

## 2021-11-06 DIAGNOSIS — I5032 Chronic diastolic (congestive) heart failure: Secondary | ICD-10-CM

## 2021-11-06 DIAGNOSIS — I480 Paroxysmal atrial fibrillation: Secondary | ICD-10-CM | POA: Diagnosis not present

## 2021-11-06 DIAGNOSIS — N189 Chronic kidney disease, unspecified: Secondary | ICD-10-CM

## 2021-11-06 DIAGNOSIS — Z794 Long term (current) use of insulin: Secondary | ICD-10-CM | POA: Diagnosis not present

## 2021-11-06 DIAGNOSIS — I10 Essential (primary) hypertension: Secondary | ICD-10-CM

## 2021-11-06 DIAGNOSIS — E118 Type 2 diabetes mellitus with unspecified complications: Secondary | ICD-10-CM

## 2021-11-06 NOTE — Patient Instructions (Signed)
Medication Instructions:  Your physician recommends that you continue on your current medications as directed. Please refer to the Current Medication list given to you today.  *If you need a refill on your cardiac medications before your next appointment, please call your pharmacy*   Lab Work: NONE ordered at this time of appointment   If you have labs (blood work) drawn today and your tests are completely normal, you will receive your results only by: MyChart Message (if you have MyChart) OR A paper copy in the mail If you have any lab test that is abnormal or we need to change your treatment, we will call you to review the results.   Testing/Procedures: NONE ordered at this time of appointment     Follow-Up: At [facility], you and your health needs are our priority.  As part of our continuing mission to provide you with exceptional heart care, we have created designated Provider Care Teams.  These Care Teams include your primary Cardiologist (physician) and Advanced Practice Providers (APPs -  Physician Assistants and Nurse Practitioners) who all work together to provide you with the care you need, when you need it.  We recommend signing up for the patient portal called "MyChart".  Sign up information is provided on this After Visit Summary.  MyChart is used to connect with patients for Virtual Visits (Telemedicine).  Patients are able to view lab/test results, encounter notes, upcoming appointments, etc.  Non-urgent messages can be sent to your provider as well.   To learn more about what you can do with MyChart, go to [email or web address].    Your next appointment:   As previously scheduled    The format for your next appointment:   In Person  Provider:   Little Ishikawa, MD     Other Instructions   Important Information About Sugar

## 2021-11-07 DIAGNOSIS — E1122 Type 2 diabetes mellitus with diabetic chronic kidney disease: Secondary | ICD-10-CM | POA: Diagnosis not present

## 2021-11-07 DIAGNOSIS — N189 Chronic kidney disease, unspecified: Secondary | ICD-10-CM | POA: Diagnosis not present

## 2021-11-07 DIAGNOSIS — R809 Proteinuria, unspecified: Secondary | ICD-10-CM | POA: Diagnosis not present

## 2021-11-07 DIAGNOSIS — E559 Vitamin D deficiency, unspecified: Secondary | ICD-10-CM | POA: Diagnosis not present

## 2021-11-07 DIAGNOSIS — E1129 Type 2 diabetes mellitus with other diabetic kidney complication: Secondary | ICD-10-CM | POA: Diagnosis not present

## 2021-11-07 DIAGNOSIS — I129 Hypertensive chronic kidney disease with stage 1 through stage 4 chronic kidney disease, or unspecified chronic kidney disease: Secondary | ICD-10-CM | POA: Diagnosis not present

## 2021-11-27 DIAGNOSIS — E1142 Type 2 diabetes mellitus with diabetic polyneuropathy: Secondary | ICD-10-CM | POA: Diagnosis not present

## 2021-11-27 DIAGNOSIS — I10 Essential (primary) hypertension: Secondary | ICD-10-CM | POA: Diagnosis not present

## 2021-12-02 ENCOUNTER — Other Ambulatory Visit: Payer: Self-pay | Admitting: Cardiology

## 2021-12-02 DIAGNOSIS — I4891 Unspecified atrial fibrillation: Secondary | ICD-10-CM

## 2021-12-02 DIAGNOSIS — I480 Paroxysmal atrial fibrillation: Secondary | ICD-10-CM

## 2021-12-03 DIAGNOSIS — E1129 Type 2 diabetes mellitus with other diabetic kidney complication: Secondary | ICD-10-CM | POA: Diagnosis not present

## 2021-12-03 DIAGNOSIS — E1122 Type 2 diabetes mellitus with diabetic chronic kidney disease: Secondary | ICD-10-CM | POA: Diagnosis not present

## 2021-12-03 DIAGNOSIS — N189 Chronic kidney disease, unspecified: Secondary | ICD-10-CM | POA: Diagnosis not present

## 2021-12-03 DIAGNOSIS — I129 Hypertensive chronic kidney disease with stage 1 through stage 4 chronic kidney disease, or unspecified chronic kidney disease: Secondary | ICD-10-CM | POA: Diagnosis not present

## 2021-12-03 DIAGNOSIS — R809 Proteinuria, unspecified: Secondary | ICD-10-CM | POA: Diagnosis not present

## 2021-12-03 DIAGNOSIS — E870 Hyperosmolality and hypernatremia: Secondary | ICD-10-CM | POA: Diagnosis not present

## 2021-12-22 DIAGNOSIS — H34232 Retinal artery branch occlusion, left eye: Secondary | ICD-10-CM | POA: Diagnosis not present

## 2021-12-22 DIAGNOSIS — H26491 Other secondary cataract, right eye: Secondary | ICD-10-CM | POA: Diagnosis not present

## 2021-12-22 DIAGNOSIS — H401132 Primary open-angle glaucoma, bilateral, moderate stage: Secondary | ICD-10-CM | POA: Diagnosis not present

## 2021-12-22 DIAGNOSIS — E113593 Type 2 diabetes mellitus with proliferative diabetic retinopathy without macular edema, bilateral: Secondary | ICD-10-CM | POA: Diagnosis not present

## 2021-12-22 DIAGNOSIS — H31093 Other chorioretinal scars, bilateral: Secondary | ICD-10-CM | POA: Diagnosis not present

## 2021-12-22 DIAGNOSIS — H3582 Retinal ischemia: Secondary | ICD-10-CM | POA: Diagnosis not present

## 2021-12-27 DIAGNOSIS — I10 Essential (primary) hypertension: Secondary | ICD-10-CM | POA: Diagnosis not present

## 2021-12-27 DIAGNOSIS — E1142 Type 2 diabetes mellitus with diabetic polyneuropathy: Secondary | ICD-10-CM | POA: Diagnosis not present

## 2022-01-13 ENCOUNTER — Encounter: Payer: Self-pay | Admitting: Podiatry

## 2022-01-13 ENCOUNTER — Ambulatory Visit: Payer: Medicare HMO | Admitting: Podiatry

## 2022-01-13 DIAGNOSIS — M79675 Pain in left toe(s): Secondary | ICD-10-CM | POA: Diagnosis not present

## 2022-01-13 DIAGNOSIS — E1142 Type 2 diabetes mellitus with diabetic polyneuropathy: Secondary | ICD-10-CM

## 2022-01-13 DIAGNOSIS — B351 Tinea unguium: Secondary | ICD-10-CM | POA: Diagnosis not present

## 2022-01-13 DIAGNOSIS — M79674 Pain in right toe(s): Secondary | ICD-10-CM | POA: Diagnosis not present

## 2022-01-19 NOTE — Progress Notes (Signed)
  Subjective:  [name], female    DOB: July 06, 1932,  MRN: [id number]  Annette Fitzgerald presents to clinic today for at risk foot care with history of diabetic neuropathy and painful thick toenails that are difficult to trim. Pain interferes with ambulation. Aggravating factors include wearing enclosed shoe gear. Pain is relieved with periodic professional debridement.  Last known HgA1c was unknown.  Patient did not check blood glucose today.  New problem(s): None.   PCP is Benetta Spar, MD , and last visit was  November 27, 2021  No Known Allergies  Review of Systems: Negative except as noted in the HPI.  Objective: No changes noted in today's physical examination. There were no vitals filed for this visit.  DOUGLAS ROOKS is a pleasant 88 y.o. female in NAD. AAO X 3.  Vascular Examination: CFT <3 seconds b/l LE. Palpable DP/PT pulses b/l LE. Digital hair absent b/l. Skin temperature gradient WNL b/l. No pain with calf compression b/l. No edema noted b/l. No cyanosis or clubbing noted b/l LE.  Dermatological Examination: Pedal integument with normal turgor, texture and tone b/l LE. No open wounds b/l. No interdigital macerations b/l. Toenails 1-5 b/l elongated, thickened, discolored with subungual debris. +Tenderness with dorsal palpation of nailplates. No hyperkeratotic or porokeratotic lesions present.  Musculoskeletal Examination: Normal muscle strength 5/5 to all lower extremity muscle groups bilaterally. HAV with bunion deformity noted b/l LE. Hammertoe deformity noted 2-5 b/l.Marland Kitchen No pain, crepitus or joint limitation noted with ROM b/l LE.  Patient ambulates independently without assistive aids.  Neurological Examination: Pt has subjective symptoms of neuropathy. Protective sensation intact 5/5 intact bilaterally with 10g monofilament b/l. Vibratory sensation intact b/l.     Latest Ref Rng & Units 05/20/2021    5:13 PM  Hemoglobin A1C   Hemoglobin-A1c 4.8 - 5.6 % 8.6    Assessment/Plan: 1. Pain due to onychomycosis of toenails of both feet   2. Diabetic peripheral neuropathy associated with type 2 diabetes mellitus (HCC)     -Examined patient. -Patient to continue soft, supportive shoe gear daily. -Mycotic toenails 1-5 bilaterally were debrided in length and girth with sterile nail nippers and dremel without incident. -Patient/POA to call should there be question/concern in the interim.   Return in about 3 months (around 04/15/2022).  Freddie Breech, DPM

## 2022-01-27 DIAGNOSIS — E1165 Type 2 diabetes mellitus with hyperglycemia: Secondary | ICD-10-CM | POA: Diagnosis not present

## 2022-01-27 DIAGNOSIS — I10 Essential (primary) hypertension: Secondary | ICD-10-CM | POA: Diagnosis not present

## 2022-02-22 NOTE — Progress Notes (Unsigned)
Cardiology Office Note:    Date:  02/26/2022   ID:  Annette Fitzgerald, DOB 08/09/1932, MRN [id number]  PCP:  Fanta, Tesfaye Demissie, MD  Cardiologist:  Camrin Gearheart L Georgeanna Radziewicz, MD  Electrophysiologist:  None   Referring MD: Fanta, Tesfaye Demissie*   Chief Complaint  Patient presents with   Atrial Fibrillation    History of Present Illness:    Annette Fitzgerald is a 88 y.o. female with a hx of dementia, paroxysmal atrial fibrillation, COPD, T2DM, hypertension who presents f follow-up.  She was admitted from 12/13 through 05/24/2021.  She presented with chest pain and shortness of breath.  On arrival to the ED, found to have SPO2 down to 85% on room air.  Troponins negative.  Found on admission to have new onset atrial fibrillation, heart rates up to 150s.  Noted to be bradycardic with metoprolol and Cardizem.  Was discharged on diltiazem 120 mg daily.  Echo showed EF 50 to 55%, grade 1 diastolic dysfunction.  Anticoagulation was discussed with patient and her daughters but they declined.  At clinic appointment on 06/11/2021, were in agreement with starting Eliquis.  Zio patch in 06/2021 was only worn for 1 day, no significant arrhythmias seen.  Since last clinic visit,  she reports she is doing well.  Denies any chest pain, dyspnea, lightheadedness, syncope, lower extremity edema, or palpitations.  No bleeding issues on Eliquis.   No recent falls.  Has not needed Lasix.  Wt Readings from Last 3 Encounters:  02/26/22 146 lb 12.8 oz (66.6 kg)  11/06/21 144 lb 9.6 oz (65.6 kg)  08/14/21 143 lb 6.4 oz (65 kg)     Past Medical History:  Diagnosis Date   Diabetes mellitus without complication (HCC)    Hypertension    Neuropathy due to type 2 diabetes mellitus (HCC)     Past Surgical History:  Procedure Laterality Date   CATARACT EXTRACTION      Current Medications: Current Meds  Medication Sig   ACCU-CHEK AVIVA PLUS test strip    ACCU-CHEK SOFTCLIX LANCETS lancets    acetaminophen  (TYLENOL) 325 MG tablet Take 2 tablets (650 mg total) by mouth every 6 (six) hours as needed for mild pain (or Fever >/= 101).   Alcohol Swabs (B-D SINGLE USE SWABS REGULAR) PADS    alendronate (FOSAMAX) 70 MG tablet Take 70 mg by mouth once a week.    ALPRAZolam (XANAX) 1 MG tablet Take by mouth.   atorvastatin (LIPITOR) 10 MG tablet Take by mouth.   BD PEN NEEDLE NANO 2ND GEN 32G X 4 MM MISC    chlorthalidone (HYGROTON) 25 MG tablet Take 12.5 mg by mouth every other day.   cholecalciferol (VITAMIN D) 25 MCG (1000 UT) tablet Take by mouth.   diltiazem (CARDIZEM CD) 120 MG 24 hr capsule Take 120 mg by mouth daily.   donepezil (ARICEPT) 10 MG tablet Take by mouth.   furosemide (LASIX) 20 MG tablet 20 mg 1 (one) time each day   gabapentin (NEURONTIN) 300 MG capsule Take 300 mg by mouth 2 (two) times daily.   glipiZIDE (GLUCOTROL XL) 10 MG 24 hr tablet Take 10 mg by mouth daily with breakfast.   Insulin Glargine (BASAGLAR KWIKPEN) 100 UNIT/ML Inject into the skin.   latanoprost (XALATAN) 0.005 % ophthalmic solution 1 drop at bedtime.   linagliptin (TRADJENTA) 5 MG TABS tablet Take 5 mg by mouth daily.   losartan (COZAAR) 100 MG tablet Take 100 mg by mouth daily.     polyethylene glycol (MIRALAX / GLYCOLAX) 17 g packet Take 17 g by mouth daily as needed for mild constipation.   timolol (TIMOPTIC) 0.25 % ophthalmic solution PLACE 1 DROP INTO BOTH EYES BID   [DISCONTINUED] apixaban (ELIQUIS) 2.5 MG TABS tablet TAKE 1 TABLET(2.5 MG) BY MOUTH TWICE DAILY     Allergies:   Patient has no known allergies.   Social History   Socioeconomic History   Marital status: Married    Spouse name: Not on file   Number of children: Not on file   Years of education: Not on file   Highest education level: Not on file  Occupational History   Not on file  Tobacco Use   Smoking status: Never   Smokeless tobacco: Never  Substance and Sexual Activity   Alcohol use: No   Drug use: No   Sexual activity: Not on  file  Other Topics Concern   Not on file  Social History Narrative   Not on file   Social Determinants of Health   Financial Resource Strain: Not on file  Food Insecurity: Not on file  Transportation Needs: Not on file  Physical Activity: Not on file  Stress: Not on file  Social Connections: Not on file     Family History: The patient's family history is not on file.  ROS:   Please see the history of present illness.     All other systems reviewed and are negative.  EKGs/Labs/Other Studies Reviewed:    The following studies were reviewed today:   EKG:   02/26/2022: Sinus bradycardia, rate 58, left bundle branch block 08/14/21: Sinus bradycardia, rate 59, left bundle branch block 06/11/21: Normal sinus rhythm, rate 62, left bundle branch block  Recent Labs: 05/20/2021: ALT 16; B Natriuretic Peptide 88.0; TSH 0.358 06/11/2021: Hemoglobin 11.3; Platelets 270 08/21/2021: BUN 21; Creatinine, Ser 1.21; Potassium 4.1; Sodium 140  Recent Lipid Panel No results found for: "CHOL", "TRIG", "HDL", "CHOLHDL", "VLDL", "LDLCALC", "LDLDIRECT"  Physical Exam:    VS:  BP (!) 140/80   Pulse (!) 58   Wt 146 lb 12.8 oz (66.6 kg)   SpO2 97%   BMI 26.00 kg/m     Wt Readings from Last 3 Encounters:  02/26/22 146 lb 12.8 oz (66.6 kg)  11/06/21 144 lb 9.6 oz (65.6 kg)  08/14/21 143 lb 6.4 oz (65 kg)     GEN:  Well nourished, well developed in no acute distress HEENT: Normal NECK: No JVD; No carotid bruits LYMPHATICS: No lymphadenopathy CARDIAC: RRR, no murmurs, rubs, gallops RESPIRATORY:  Clear to auscultation without rales, wheezing or rhonchi  ABDOMEN: Soft, non-tender, non-distended MUSCULOSKELETAL:  No edema; No deformity  SKIN: Warm and dry NEUROLOGIC:  Alert and oriented x 2, did not know year PSYCHIATRIC:  Normal affect   ASSESSMENT:    1. Paroxysmal atrial fibrillation (HCC)   2. Chronic diastolic heart failure (HCC)   3. Essential hypertension   4. Mixed hyperlipidemia    5. AKI (acute kidney injury) (HCC)      PLAN:    Paroxysmal atrial fibrillation: Diagnosed during admission in 05/2021.  CHA2DS2-VASc score 5 (hypertension, age x2, DM, female).  Echocardiogram on 05/21/2021 showed EF 50 to 55%, moderate LVH, grade 1 diastolic dysfunction, normal RV function, no significant valve disease -Unable to tolerate metoprolol or diltiazem due to bradycardia.  Ordered Zio patch x2 weeks to evaluate A. fib burden but was only able to wear for 1 day, no significant arrhythmias seen.  If having issues   with A. fib with RVR, will plan referral to EP for tachybrady syndrome -Discussed risks and benefits of anticoagulation and at clinic appointment in January 2023 and started 2.5 mg twice daily given age greater than 80 and creatinine greater than 1.5.  Her kidney function has improved, creatinine has been consistently less than 1.5, will increase Eliquis to 5 mg twice daily  Chronic diastolic heart failure: Discharged from hospital 05/2021 on Lasix 20 mg daily.  Has not been taking.  Recommend monitoring daily weights and take Lasix 20 mg as needed if gains more than 3 pounds 1 day or 5 pounds 1 week  Hypertension: Can continue losartan 100 mg daily  Hyperlipidemia: LDL 87 on 04/23/20, on atorvastatin 10 mg daily  T2DM: on insulin.  A1c 8.6% on 05/10/21  AKI on CKD: Creatinine 1.52 on discharge on 05/24/2021.  Has improved, most recent BMET on 11/07/21 showed creatinine improved to 1.27    RTC in 3 months   Medication Adjustments/Labs and Tests Ordered: Current medicines are reviewed at length with the patient today.  Concerns regarding medicines are outlined above.  Orders Placed This Encounter  Procedures   EKG 12-Lead   Meds ordered this encounter  Medications   apixaban (ELIQUIS) 5 MG TABS tablet    Sig: Take 1 tablet (5 mg total) by mouth 2 (two) times daily.    Dispense:  180 tablet    Refill:  3    Patient Instructions  Medication Instructions:  Change  Eliquis to 5mg two times daily  *If you need a refill on your cardiac medications before your next appointment, please call your pharmacy*  Follow-Up: At [facility], you and your health needs are our priority.  As part of our continuing mission to provide you with exceptional heart care, we have created designated Provider Care Teams.  These Care Teams include your primary Cardiologist (physician) and Advanced Practice Providers (APPs -  Physician Assistants and Nurse Practitioners) who all work together to provide you with the care you need, when you need it.  We recommend signing up for the patient portal called "MyChart".  Sign up information is provided on this After Visit Summary.  MyChart is used to connect with patients for Virtual Visits (Telemedicine).  Patients are able to view lab/test results, encounter notes, upcoming appointments, etc.  Non-urgent messages can be sent to your provider as well.   To learn more about what you can do with MyChart, go to [email or web address].    Your next appointment:   6 month(s)  The format for your next appointment:   In Person  Provider:   Keisy Strickler L Layth Cerezo, MD             Signed, Saquoia Sianez L Ebrima Ranta, MD  02/26/2022 4:54 PM    [facility] 

## 2022-02-26 ENCOUNTER — Ambulatory Visit: Payer: Medicare HMO | Attending: Cardiology | Admitting: Cardiology

## 2022-02-26 ENCOUNTER — Encounter: Payer: Self-pay | Admitting: Cardiology

## 2022-02-26 VITALS — BP 140/80 | HR 58 | Wt 146.8 lb

## 2022-02-26 DIAGNOSIS — I10 Essential (primary) hypertension: Secondary | ICD-10-CM | POA: Diagnosis not present

## 2022-02-26 DIAGNOSIS — I480 Paroxysmal atrial fibrillation: Secondary | ICD-10-CM | POA: Diagnosis not present

## 2022-02-26 DIAGNOSIS — N179 Acute kidney failure, unspecified: Secondary | ICD-10-CM

## 2022-02-26 DIAGNOSIS — E782 Mixed hyperlipidemia: Secondary | ICD-10-CM

## 2022-02-26 DIAGNOSIS — I5032 Chronic diastolic (congestive) heart failure: Secondary | ICD-10-CM | POA: Diagnosis not present

## 2022-02-26 MED ORDER — APIXABAN 5 MG PO TABS: 5.0000 mg | ORAL_TABLET | Freq: Two times a day (BID) | ORAL | 3 refills | Status: DC

## 2022-02-26 NOTE — Patient Instructions (Signed)
Medication Instructions:  Change Eliquis to 5mg  two times daily  *If you need a refill on your cardiac medications before your next appointment, please call your pharmacy*  Follow-Up: At [facility], you and your health needs are our priority.  As part of our continuing mission to provide you with exceptional heart care, we have created designated Provider Care Teams.  These Care Teams include your primary Cardiologist (physician) and Advanced Practice Providers (APPs -  Physician Assistants and Nurse Practitioners) who all work together to provide you with the care you need, when you need it.  We recommend signing up for the patient portal called "MyChart".  Sign up information is provided on this After Visit Summary.  MyChart is used to connect with patients for Virtual Visits (Telemedicine).  Patients are able to view lab/test results, encounter notes, upcoming appointments, etc.  Non-urgent messages can be sent to your provider as well.   To learn more about what you can do with MyChart, go to [email or web address].    Your next appointment:   6 month(s)  The format for your next appointment:   In Person  Provider:   Donato Heinz, MD

## 2022-02-27 DIAGNOSIS — G301 Alzheimer's disease with late onset: Secondary | ICD-10-CM | POA: Diagnosis not present

## 2022-02-27 DIAGNOSIS — E1142 Type 2 diabetes mellitus with diabetic polyneuropathy: Secondary | ICD-10-CM | POA: Diagnosis not present

## 2022-02-27 DIAGNOSIS — N182 Chronic kidney disease, stage 2 (mild): Secondary | ICD-10-CM | POA: Diagnosis not present

## 2022-02-27 DIAGNOSIS — E7849 Other hyperlipidemia: Secondary | ICD-10-CM | POA: Diagnosis not present

## 2022-02-27 DIAGNOSIS — I10 Essential (primary) hypertension: Secondary | ICD-10-CM | POA: Diagnosis not present

## 2022-02-27 DIAGNOSIS — L80 Vitiligo: Secondary | ICD-10-CM | POA: Diagnosis not present

## 2022-02-27 DIAGNOSIS — F411 Generalized anxiety disorder: Secondary | ICD-10-CM | POA: Diagnosis not present

## 2022-03-06 DIAGNOSIS — I129 Hypertensive chronic kidney disease with stage 1 through stage 4 chronic kidney disease, or unspecified chronic kidney disease: Secondary | ICD-10-CM | POA: Diagnosis not present

## 2022-03-06 DIAGNOSIS — R809 Proteinuria, unspecified: Secondary | ICD-10-CM | POA: Diagnosis not present

## 2022-03-06 DIAGNOSIS — N189 Chronic kidney disease, unspecified: Secondary | ICD-10-CM | POA: Diagnosis not present

## 2022-03-06 DIAGNOSIS — E1122 Type 2 diabetes mellitus with diabetic chronic kidney disease: Secondary | ICD-10-CM | POA: Diagnosis not present

## 2022-03-06 DIAGNOSIS — E1129 Type 2 diabetes mellitus with other diabetic kidney complication: Secondary | ICD-10-CM | POA: Diagnosis not present

## 2022-03-06 DIAGNOSIS — E870 Hyperosmolality and hypernatremia: Secondary | ICD-10-CM | POA: Diagnosis not present

## 2022-03-11 DIAGNOSIS — E1122 Type 2 diabetes mellitus with diabetic chronic kidney disease: Secondary | ICD-10-CM | POA: Diagnosis not present

## 2022-03-11 DIAGNOSIS — R809 Proteinuria, unspecified: Secondary | ICD-10-CM | POA: Diagnosis not present

## 2022-03-11 DIAGNOSIS — I129 Hypertensive chronic kidney disease with stage 1 through stage 4 chronic kidney disease, or unspecified chronic kidney disease: Secondary | ICD-10-CM | POA: Diagnosis not present

## 2022-03-11 DIAGNOSIS — N189 Chronic kidney disease, unspecified: Secondary | ICD-10-CM | POA: Diagnosis not present

## 2022-03-11 DIAGNOSIS — E1129 Type 2 diabetes mellitus with other diabetic kidney complication: Secondary | ICD-10-CM | POA: Diagnosis not present

## 2022-03-11 DIAGNOSIS — E870 Hyperosmolality and hypernatremia: Secondary | ICD-10-CM | POA: Diagnosis not present

## 2022-03-29 DIAGNOSIS — I10 Essential (primary) hypertension: Secondary | ICD-10-CM | POA: Diagnosis not present

## 2022-03-29 DIAGNOSIS — E1142 Type 2 diabetes mellitus with diabetic polyneuropathy: Secondary | ICD-10-CM | POA: Diagnosis not present

## 2022-04-21 DIAGNOSIS — Z1331 Encounter for screening for depression: Secondary | ICD-10-CM | POA: Diagnosis not present

## 2022-04-21 DIAGNOSIS — Z0001 Encounter for general adult medical examination with abnormal findings: Secondary | ICD-10-CM | POA: Diagnosis not present

## 2022-04-21 DIAGNOSIS — Z1389 Encounter for screening for other disorder: Secondary | ICD-10-CM | POA: Diagnosis not present

## 2022-04-21 DIAGNOSIS — E7849 Other hyperlipidemia: Secondary | ICD-10-CM | POA: Diagnosis not present

## 2022-04-21 DIAGNOSIS — G301 Alzheimer's disease with late onset: Secondary | ICD-10-CM | POA: Diagnosis not present

## 2022-04-21 DIAGNOSIS — Z23 Encounter for immunization: Secondary | ICD-10-CM | POA: Diagnosis not present

## 2022-04-21 DIAGNOSIS — I10 Essential (primary) hypertension: Secondary | ICD-10-CM | POA: Diagnosis not present

## 2022-04-21 DIAGNOSIS — F411 Generalized anxiety disorder: Secondary | ICD-10-CM | POA: Diagnosis not present

## 2022-04-21 DIAGNOSIS — E1142 Type 2 diabetes mellitus with diabetic polyneuropathy: Secondary | ICD-10-CM | POA: Diagnosis not present

## 2022-04-28 ENCOUNTER — Ambulatory Visit: Payer: Medicare HMO | Admitting: Podiatry

## 2022-04-28 ENCOUNTER — Encounter: Payer: Self-pay | Admitting: Podiatry

## 2022-04-28 DIAGNOSIS — M79675 Pain in left toe(s): Secondary | ICD-10-CM

## 2022-04-28 DIAGNOSIS — M79674 Pain in right toe(s): Secondary | ICD-10-CM

## 2022-04-28 DIAGNOSIS — B351 Tinea unguium: Secondary | ICD-10-CM | POA: Diagnosis not present

## 2022-04-28 DIAGNOSIS — E1142 Type 2 diabetes mellitus with diabetic polyneuropathy: Secondary | ICD-10-CM | POA: Diagnosis not present

## 2022-04-28 NOTE — Progress Notes (Signed)
  Subjective:  [name], female    DOB: 09-02-1932,  MRN: [id number]  Annette Fitzgerald presents to clinic today for at risk foot care with history of diabetic neuropathy and painful thick toenails that are difficult to trim. Pain interferes with ambulation. Aggravating factors include wearing enclosed shoe gear. Pain is relieved with periodic professional debridement.  Chief Complaint  Patient presents with   foot care    Patient is here for diabetic foot care, blood sugar 109 yesterday, last A1c 8.0, primary care is Dr. Felecia Shelling last seen Monday.   New problem(s): None.   PCP is Fanta, Wayland Salinas, MD.  Her daughter is present during today's visit.  No Known Allergies  Review of Systems: Negative except as noted in the HPI.  Objective: No changes noted in today's physical examination.  Annette Fitzgerald is a pleasant 88 y.o. female WD, WN in NAD. AAO x 3.  Vascular Examination: CFT <3 seconds b/l LE. Palpable DP/PT pulses b/l LE. Digital hair absent b/l. Skin temperature gradient WNL b/l. No pain with calf compression b/l. No edema noted b/l. No cyanosis or clubbing noted b/l LE.  Dermatological Examination: Pedal integument with normal turgor, texture and tone b/l LE. No open wounds b/l. No interdigital macerations b/l. Toenails 1-5 b/l elongated, thickened, discolored with subungual debris. +Tenderness with dorsal palpation of nailplates. No hyperkeratotic or porokeratotic lesions present.  Musculoskeletal Examination: Normal muscle strength 5/5 to all lower extremity muscle groups bilaterally. HAV with bunion deformity noted b/l LE. Hammertoe deformity noted 2-5 b/l.Marland Kitchen No pain, crepitus or joint limitation noted with ROM b/l LE.  Patient ambulates independently without assistive aids.  Neurological Examination: Pt has subjective symptoms of neuropathy. Protective sensation intact 5/5 intact bilaterally with 10g monofilament b/l. Vibratory sensation intact  b/l.  Assessment/Plan: 1. Pain due to onychomycosis of toenails of both feet   2. Diabetic peripheral neuropathy associated with type 2 diabetes mellitus (HCC)     No orders of the defined types were placed in this encounter.   -Patient's family member present. All questions/concerns addressed on today's visit. -No new findings. No new orders. -Continue diabetic foot care principles: inspect feet daily, monitor glucose as recommended by PCP and/or Endocrinologist, and follow prescribed diet per PCP, Endocrinologist and/or dietician. -Toenails 1-5 b/l were debrided in length and girth with sterile nail nippers and dremel without iatrogenic bleeding.  -Patient/POA to call should there be question/concern in the interim.   Return in about 3 months (around 07/29/2022).  Annette Fitzgerald, DPM

## 2022-05-21 DIAGNOSIS — N183 Chronic kidney disease, stage 3 unspecified: Secondary | ICD-10-CM | POA: Diagnosis not present

## 2022-05-21 DIAGNOSIS — I10 Essential (primary) hypertension: Secondary | ICD-10-CM | POA: Diagnosis not present

## 2022-06-15 DIAGNOSIS — N189 Chronic kidney disease, unspecified: Secondary | ICD-10-CM | POA: Diagnosis not present

## 2022-06-15 DIAGNOSIS — R809 Proteinuria, unspecified: Secondary | ICD-10-CM | POA: Diagnosis not present

## 2022-06-15 DIAGNOSIS — E870 Hyperosmolality and hypernatremia: Secondary | ICD-10-CM | POA: Diagnosis not present

## 2022-06-15 DIAGNOSIS — E1122 Type 2 diabetes mellitus with diabetic chronic kidney disease: Secondary | ICD-10-CM | POA: Diagnosis not present

## 2022-06-15 DIAGNOSIS — E1129 Type 2 diabetes mellitus with other diabetic kidney complication: Secondary | ICD-10-CM | POA: Diagnosis not present

## 2022-06-15 DIAGNOSIS — I129 Hypertensive chronic kidney disease with stage 1 through stage 4 chronic kidney disease, or unspecified chronic kidney disease: Secondary | ICD-10-CM | POA: Diagnosis not present

## 2022-06-18 DIAGNOSIS — I129 Hypertensive chronic kidney disease with stage 1 through stage 4 chronic kidney disease, or unspecified chronic kidney disease: Secondary | ICD-10-CM | POA: Diagnosis not present

## 2022-06-18 DIAGNOSIS — I160 Hypertensive urgency: Secondary | ICD-10-CM | POA: Diagnosis not present

## 2022-06-18 DIAGNOSIS — E1129 Type 2 diabetes mellitus with other diabetic kidney complication: Secondary | ICD-10-CM | POA: Diagnosis not present

## 2022-06-18 DIAGNOSIS — R809 Proteinuria, unspecified: Secondary | ICD-10-CM | POA: Diagnosis not present

## 2022-06-18 DIAGNOSIS — N189 Chronic kidney disease, unspecified: Secondary | ICD-10-CM | POA: Diagnosis not present

## 2022-06-18 DIAGNOSIS — E1122 Type 2 diabetes mellitus with diabetic chronic kidney disease: Secondary | ICD-10-CM | POA: Diagnosis not present

## 2022-06-21 DIAGNOSIS — I10 Essential (primary) hypertension: Secondary | ICD-10-CM | POA: Diagnosis not present

## 2022-06-21 DIAGNOSIS — N183 Chronic kidney disease, stage 3 unspecified: Secondary | ICD-10-CM | POA: Diagnosis not present

## 2022-07-22 DIAGNOSIS — N183 Chronic kidney disease, stage 3 unspecified: Secondary | ICD-10-CM | POA: Diagnosis not present

## 2022-07-22 DIAGNOSIS — I10 Essential (primary) hypertension: Secondary | ICD-10-CM | POA: Diagnosis not present

## 2022-07-23 DIAGNOSIS — E1129 Type 2 diabetes mellitus with other diabetic kidney complication: Secondary | ICD-10-CM | POA: Diagnosis not present

## 2022-07-23 DIAGNOSIS — R809 Proteinuria, unspecified: Secondary | ICD-10-CM | POA: Diagnosis not present

## 2022-07-23 DIAGNOSIS — I160 Hypertensive urgency: Secondary | ICD-10-CM | POA: Diagnosis not present

## 2022-07-23 DIAGNOSIS — I129 Hypertensive chronic kidney disease with stage 1 through stage 4 chronic kidney disease, or unspecified chronic kidney disease: Secondary | ICD-10-CM | POA: Diagnosis not present

## 2022-07-23 DIAGNOSIS — E1122 Type 2 diabetes mellitus with diabetic chronic kidney disease: Secondary | ICD-10-CM | POA: Diagnosis not present

## 2022-07-23 DIAGNOSIS — N189 Chronic kidney disease, unspecified: Secondary | ICD-10-CM | POA: Diagnosis not present

## 2022-07-28 DIAGNOSIS — N1832 Chronic kidney disease, stage 3b: Secondary | ICD-10-CM | POA: Diagnosis not present

## 2022-07-28 DIAGNOSIS — E1122 Type 2 diabetes mellitus with diabetic chronic kidney disease: Secondary | ICD-10-CM | POA: Diagnosis not present

## 2022-07-28 DIAGNOSIS — I129 Hypertensive chronic kidney disease with stage 1 through stage 4 chronic kidney disease, or unspecified chronic kidney disease: Secondary | ICD-10-CM | POA: Diagnosis not present

## 2022-07-28 DIAGNOSIS — R809 Proteinuria, unspecified: Secondary | ICD-10-CM | POA: Diagnosis not present

## 2022-07-28 DIAGNOSIS — N189 Chronic kidney disease, unspecified: Secondary | ICD-10-CM | POA: Diagnosis not present

## 2022-07-28 DIAGNOSIS — E1129 Type 2 diabetes mellitus with other diabetic kidney complication: Secondary | ICD-10-CM | POA: Diagnosis not present

## 2022-07-28 DIAGNOSIS — E559 Vitamin D deficiency, unspecified: Secondary | ICD-10-CM | POA: Diagnosis not present

## 2022-08-03 DIAGNOSIS — H31093 Other chorioretinal scars, bilateral: Secondary | ICD-10-CM | POA: Diagnosis not present

## 2022-08-03 DIAGNOSIS — Z961 Presence of intraocular lens: Secondary | ICD-10-CM | POA: Diagnosis not present

## 2022-08-03 DIAGNOSIS — E113593 Type 2 diabetes mellitus with proliferative diabetic retinopathy without macular edema, bilateral: Secondary | ICD-10-CM | POA: Diagnosis not present

## 2022-08-03 DIAGNOSIS — H3582 Retinal ischemia: Secondary | ICD-10-CM | POA: Diagnosis not present

## 2022-08-03 DIAGNOSIS — H401132 Primary open-angle glaucoma, bilateral, moderate stage: Secondary | ICD-10-CM | POA: Diagnosis not present

## 2022-08-19 ENCOUNTER — Ambulatory Visit (INDEPENDENT_AMBULATORY_CARE_PROVIDER_SITE_OTHER): Payer: Medicare HMO | Admitting: Podiatry

## 2022-08-19 ENCOUNTER — Ambulatory Visit: Payer: Medicare HMO | Admitting: Podiatry

## 2022-08-19 ENCOUNTER — Encounter: Payer: Self-pay | Admitting: Podiatry

## 2022-08-19 VITALS — BP 141/57

## 2022-08-19 DIAGNOSIS — M2041 Other hammer toe(s) (acquired), right foot: Secondary | ICD-10-CM

## 2022-08-19 DIAGNOSIS — B351 Tinea unguium: Secondary | ICD-10-CM | POA: Diagnosis not present

## 2022-08-19 DIAGNOSIS — M2042 Other hammer toe(s) (acquired), left foot: Secondary | ICD-10-CM | POA: Diagnosis not present

## 2022-08-19 DIAGNOSIS — E119 Type 2 diabetes mellitus without complications: Secondary | ICD-10-CM

## 2022-08-19 DIAGNOSIS — M79675 Pain in left toe(s): Secondary | ICD-10-CM | POA: Diagnosis not present

## 2022-08-19 DIAGNOSIS — E1142 Type 2 diabetes mellitus with diabetic polyneuropathy: Secondary | ICD-10-CM

## 2022-08-19 DIAGNOSIS — M79674 Pain in right toe(s): Secondary | ICD-10-CM

## 2022-08-19 DIAGNOSIS — M2011 Hallux valgus (acquired), right foot: Secondary | ICD-10-CM

## 2022-08-19 DIAGNOSIS — M2012 Hallux valgus (acquired), left foot: Secondary | ICD-10-CM

## 2022-08-20 DIAGNOSIS — I10 Essential (primary) hypertension: Secondary | ICD-10-CM | POA: Diagnosis not present

## 2022-08-20 DIAGNOSIS — N183 Chronic kidney disease, stage 3 unspecified: Secondary | ICD-10-CM | POA: Diagnosis not present

## 2022-08-23 NOTE — Progress Notes (Signed)
  Subjective:  [name], female    DOB: March 30, 1933,  MRN: [id number]  Annette Fitzgerald presents to clinic today for for annual diabetic foot examination  Chief Complaint  Patient presents with   Nail Problem    Annette Fitzgerald BS-did not check today A1C-did not know Fitzgerald-Annette Fitzgerald VST-2024   New problem(s): None.   Fitzgerald is Annette, Annette Baxter, MD.  No Known Allergies  Review of Systems: Negative except as noted in the HPI.  Objective: No changes noted in today's physical examination. Vitals:   08/19/22 1550  BP: (!) 141/57   Annette Fitzgerald is a pleasant 89 y.o. female WD, WN in NAD. AAO x 3.  Vascular Examination: CFT <3 seconds b/l LE. Palpable DP/PT pulses b/l LE. Digital hair absent b/l. Skin temperature gradient WNL b/l. No pain with calf compression b/l. No edema noted b/l. No cyanosis or clubbing noted b/l LE.  Dermatological Examination: Pedal integument with normal turgor, texture and tone b/l LE. No open wounds b/l. No interdigital macerations b/l. Toenails 1-5 b/l elongated, thickened, discolored with subungual debris. +Tenderness with dorsal palpation of nailplates. No hyperkeratotic or porokeratotic lesions present.  Musculoskeletal Examination: Normal muscle strength 5/5 to all lower extremity muscle groups bilaterally. HAV with bunion deformity noted b/l LE. Hammertoe deformity noted 2-5 b/l. No pain, crepitus or joint limitation noted with ROM b/l LE.  Patient ambulates independently without assistive aids.  Neurological Examination: Pt has subjective symptoms of neuropathy. Protective sensation intact 5/5 intact bilaterally with 10g monofilament b/l. Vibratory sensation intact b/l.  Assessment/Plan: 1. Pain due to onychomycosis of toenails of both feet   2. Acquired hammertoes of both feet   3. Hallux valgus, acquired, bilateral   4. Diabetic peripheral neuropathy associated with type 2 diabetes mellitus ([place])   5. Encounter for diabetic foot  exam [facility])    -Patient was evaluated and treated. All patient's and/or POA's questions/concerns answered on today's visit. -Diabetic foot examination performed today. -Continue diabetic foot care principles: inspect feet daily, monitor glucose as recommended by Fitzgerald and/or Endocrinologist, and follow prescribed diet per Fitzgerald, Endocrinologist and/or dietician. -Patient to continue soft, supportive shoe gear daily. -Mycotic toenails 1-5 bilaterally were debrided in length and girth with sterile nail nippers and dremel without incident. -Patient/POA to call should there be question/concern in the interim.   Return in about 3 months (around 11/19/2022).  Marzetta Board, DPM

## 2022-09-01 NOTE — Progress Notes (Addendum)
Cardiology Office Note:    Date:  09/07/2022   ID:  VANIA BUDHRAM, DOB 06/21/1932, MRN [id number]  PCP:  Carrolyn Meiers, MD  Cardiologist:  Donato Heinz, MD  Electrophysiologist:  None   Referring MD: Carrolyn Meiers*   Chief Complaint  Patient presents with   Atrial Fibrillation    History of Present Illness:    DEMAYA JANOFF is a 89 y.o. female with a hx of dementia, paroxysmal atrial fibrillation, COPD, T2DM, hypertension who presents for follow-up.  She was admitted from 12/13 through 05/24/2021.  She presented with chest pain and shortness of breath.  On arrival to the ED, found to have SPO2 down to 85% on room air.  Troponins negative.  Found on admission to have new onset atrial fibrillation, heart rates up to 150s.  Noted to be bradycardic with metoprolol and Cardizem.  Was discharged on diltiazem 120 mg daily.  Echo showed EF 50 to [id number], grade 1 diastolic dysfunction.  Anticoagulation was discussed with patient and her daughters but they declined.  At clinic appointment on 06/11/2021, were in agreement with starting Eliquis.  Zio patch in 06/2021 was only worn for 1 day, no significant arrhythmias seen.  Since last clinic visit, reports she has been doing well.  Denies any chest pain, dyspnea, lightheadedness, syncope, lower extremity edema, or palpitations.  Taking lasix about twice per week.    Taking Eliquis, no bleeding issues.  No falls.  Reports BP has been normal at home.  Started on hydralazine 50 mg BID and nifedipine 60 mg daily by nephrologist recently.     Wt Readings from Last 3 Encounters:  09/02/22 153 lb 6.4 oz (69.6 kg)  02/26/22 146 lb 12.8 oz (66.6 kg)  11/06/21 144 lb 9.6 oz (65.6 kg)     Past Medical History:  Diagnosis Date   Diabetes mellitus without complication ([place])    Hypertension    Neuropathy due to type 2 diabetes mellitus ([place])     Past Surgical History:  Procedure Laterality Date   CATARACT EXTRACTION       Current Medications: Current Meds  Medication Sig   ACCU-CHEK AVIVA PLUS test strip    ACCU-CHEK SOFTCLIX LANCETS lancets    acetaminophen (TYLENOL) 325 MG tablet Take 2 tablets (650 mg total) by mouth every 6 (six) hours as needed for mild pain (or Fever >/= 101).   Alcohol Swabs (B-D SINGLE USE SWABS REGULAR) PADS    alendronate (FOSAMAX) 70 MG tablet Take 70 mg by mouth once a week.    ALPRAZolam (XANAX) 1 MG tablet Take by mouth.   apixaban (ELIQUIS) 5 MG TABS tablet Take 1 tablet (5 mg total) by mouth 2 (two) times daily.   atorvastatin (LIPITOR) 10 MG tablet Take by mouth.   BD PEN NEEDLE NANO 2ND GEN 32G X 4 MM MISC    chlorthalidone (HYGROTON) 25 MG tablet Take 12.5 mg by mouth every other day.   cholecalciferol (VITAMIN D) 25 MCG (1000 UT) tablet Take by mouth.   diltiazem (CARDIZEM CD) 120 MG 24 hr capsule Take 120 mg by mouth daily.   donepezil (ARICEPT) 10 MG tablet Take by mouth.   furosemide (LASIX) 20 MG tablet 20 mg 1 (one) time each day   gabapentin (NEURONTIN) 300 MG capsule Take 300 mg by mouth 2 (two) times daily.   glipiZIDE (GLUCOTROL XL) 10 MG 24 hr tablet Take 10 mg by mouth daily with breakfast.   hydrALAZINE (APRESOLINE) 50 MG  tablet Take 50 mg by mouth in the morning and at bedtime.   Insulin Glargine (BASAGLAR KWIKPEN) 100 UNIT/ML Inject into the skin.   latanoprost (XALATAN) 0.005 % ophthalmic solution 1 drop at bedtime.   linagliptin (TRADJENTA) 5 MG TABS tablet Take 5 mg by mouth daily.   losartan (COZAAR) 100 MG tablet Take 100 mg by mouth daily.   NIFEdipine (ADALAT CC) 60 MG 24 hr tablet Take 60 mg by mouth daily.   polyethylene glycol (MIRALAX / GLYCOLAX) 17 g packet Take 17 g by mouth daily as needed for mild constipation.   timolol (TIMOPTIC) 0.25 % ophthalmic solution PLACE 1 DROP INTO BOTH EYES BID     Allergies:   Patient has no known allergies.   Social History   Socioeconomic History   Marital status: Married    Spouse name: Not on  file   Number of children: Not on file   Years of education: Not on file   Highest education level: Not on file  Occupational History   Not on file  Tobacco Use   Smoking status: Never   Smokeless tobacco: Never  Substance and Sexual Activity   Alcohol use: No   Drug use: No   Sexual activity: Not on file  Other Topics Concern   Not on file  Social History Narrative   Not on file   Social Determinants of Health   Financial Resource Strain: Not on file  Food Insecurity: Not on file  Transportation Needs: Not on file  Physical Activity: Not on file  Stress: Not on file  Social Connections: Not on file     Family History: The patient's family history is not on file.  ROS:   Please see the history of present illness.     All other systems reviewed and are negative.  EKGs/Labs/Other Studies Reviewed:    The following studies were reviewed today:   EKG:   09/02/2022: sinus rhythm, rate 73, left bundle branch block 02/26/2022: Sinus bradycardia, rate 58, left bundle branch block 08/14/21: Sinus bradycardia, rate 59, left bundle branch block 06/11/21: Normal sinus rhythm, rate 62, left bundle branch block  Recent Labs: 09/02/2022: BUN 22; Creatinine, Ser 1.37; Magnesium 2.0; Potassium 5.3; Sodium 136  Recent Lipid Panel No results found for: "CHOL", "TRIG", "HDL", "CHOLHDL", "VLDL", "LDLCALC", "LDLDIRECT"  Physical Exam:    VS:  BP (!) 165/50   Pulse 73   Ht 5\' 3"  (1.6 m)   Wt 153 lb 6.4 oz (69.6 kg)   SpO2 94%   BMI 27.17 kg/m     Wt Readings from Last 3 Encounters:  09/02/22 153 lb 6.4 oz (69.6 kg)  02/26/22 146 lb 12.8 oz (66.6 kg)  11/06/21 144 lb 9.6 oz (65.6 kg)     GEN:  Well nourished, well developed in no acute distress HEENT: Normal NECK: No JVD; No carotid bruits LYMPHATICS: No lymphadenopathy CARDIAC: RRR, no murmurs, rubs, gallops RESPIRATORY:  Clear to auscultation without rales, wheezing or rhonchi  ABDOMEN: Soft, non-tender,  non-distended MUSCULOSKELETAL:  No edema; No deformity  SKIN: Warm and dry NEUROLOGIC:  Alert and oriented x 2, did not know year PSYCHIATRIC:  Normal affect   ASSESSMENT:    1. Paroxysmal atrial fibrillation (HCC)   2. Chronic diastolic heart failure ([place])   3. Essential hypertension   4. Mixed hyperlipidemia   5. Chronic kidney disease, unspecified CKD stage       PLAN:    Paroxysmal atrial fibrillation: Diagnosed during admission in  05/2021.  CHA2DS2-VASc score 5 (hypertension, age x2, DM, female).  Echocardiogram on 05/21/2021 showed EF 50 to 55%, moderate LVH, grade 1 diastolic dysfunction, normal RV function, no significant valve disease -Unable to tolerate metoprolol or diltiazem due to bradycardia.  Ordered Zio patch x2 weeks to evaluate A. fib burden but was only able to wear for 1 day, no significant arrhythmias seen.  If having issues with A. fib with RVR, will plan referral to EP for tachybrady syndrome -Diltiazem is now listed on her med list but unclear when this was prescribed.  Recommend calling to let us know if she is taking this (daughter does not think she is taking) -Discussed risks and benefits of anticoagulation and at clinic appointment in January 2023 and started 2.5 mg twice daily given age greater than 80 and creatinine greater than 1.5.  Her kidney function has improved, creatinine has been consistently less than 1.5, increased Eliquis to 5 mg twice daily  Chronic diastolic heart failure: Discharged from hospital 05/2021 on Lasix 20 mg daily.  Has not been taking.  Recommend monitoring daily weights and take Lasix 20 mg as needed if gains more than 3 pounds 1 day or 5 pounds 1 week  Hypertension: Has been on losartan 100 mg daily but appears since last clinic visit she is now additionally on chlorthalidone 12.5 mg daily, possibly diltiazem 120 mg daily, hydralazine 50 mg twice daily and nifedipine 60 mg daily.  Recommend calling to let us know what meds she is  taking as she and her daughter are not clear.  Recommend checking blood pressure twice daily for next week and let us know results.  Check BMET  Hyperlipidemia: LDL 120 on 04/21/22, on atorvastatin 10 mg daily  T2DM: on insulin.  A1c 8.4% on 04/21/2022  AKI on CKD: Creatinine 1.52 on discharge on 05/24/2021.  Has improved, most recent creatinine was 1.35 on 07/23/22. Check BMET  RTC in 6 months   Medication Adjustments/Labs and Tests Ordered: Current medicines are reviewed at length with the patient today.  Concerns regarding medicines are outlined above.  Orders Placed This Encounter  Procedures   Magnesium   Basic metabolic panel   EKG [id number]   No orders of the defined types were placed in this encounter.   Patient Instructions  Medication Instructions:  Call when you get home to confirm medications  *If you need a refill on your cardiac medications before your next appointment, please call your pharmacy*  Lab Work: BMET, Mag today  If you have labs (blood work) drawn today and your tests are completely normal, you will receive your results only by: [place] (if you have MyChart) OR A paper copy in the mail If you have any lab test that is abnormal or we need to change your treatment, we will call you to review the results.  Follow-Up: At [facility], you and your health needs are our priority.  As part of our continuing mission to provide you with exceptional heart care, we have created designated Provider Care Teams.  These Care Teams include your primary Cardiologist (physician) and Advanced Practice Providers (APPs -  Physician Assistants and Nurse Practitioners) who all work together to provide you with the care you need, when you need it.  We recommend signing up for the patient portal called "MyChart".  Sign up information is provided on this After Visit Summary.  MyChart is used to connect with patients for Virtual Visits (Telemedicine).  Patients are  able to  view lab/test results, encounter notes, upcoming appointments, etc.  Non-urgent messages can be sent to your provider as well.   To learn more about what you can do with MyChart, go to [email or web address].    Your next appointment:   6 month(s)  Provider:   Donato Heinz, MD     Other Instructions Please check your blood pressure at home twice daily, write it down.  Call the office or send message via Mychart with the readings in 1 week for Dr. Gardiner Rhyme to review.      Signed, Donato Heinz, MD  09/07/2022 8:04 AM    [place]

## 2022-09-02 ENCOUNTER — Encounter: Payer: Self-pay | Admitting: Cardiology

## 2022-09-02 ENCOUNTER — Ambulatory Visit: Payer: Medicare HMO | Attending: Cardiology | Admitting: Cardiology

## 2022-09-02 VITALS — BP 165/50 | HR 73 | Ht 63.0 in | Wt 153.4 lb

## 2022-09-02 DIAGNOSIS — N189 Chronic kidney disease, unspecified: Secondary | ICD-10-CM

## 2022-09-02 DIAGNOSIS — I10 Essential (primary) hypertension: Secondary | ICD-10-CM | POA: Diagnosis not present

## 2022-09-02 DIAGNOSIS — I480 Paroxysmal atrial fibrillation: Secondary | ICD-10-CM | POA: Diagnosis not present

## 2022-09-02 DIAGNOSIS — I5032 Chronic diastolic (congestive) heart failure: Secondary | ICD-10-CM

## 2022-09-02 DIAGNOSIS — E782 Mixed hyperlipidemia: Secondary | ICD-10-CM

## 2022-09-02 NOTE — Patient Instructions (Addendum)
Medication Instructions:  Call when you get home to confirm medications  *If you need a refill on your cardiac medications before your next appointment, please call your pharmacy*  Lab Work: BMET, Mag today  If you have labs (blood work) drawn today and your tests are completely normal, you will receive your results only by: [place] (if you have MyChart) OR A paper copy in the mail If you have any lab test that is abnormal or we need to change your treatment, we will call you to review the results.  Follow-Up: At [facility], you and your health needs are our priority.  As part of our continuing mission to provide you with exceptional heart care, we have created designated Provider Care Teams.  These Care Teams include your primary Cardiologist (physician) and Advanced Practice Providers (APPs -  Physician Assistants and Nurse Practitioners) who all work together to provide you with the care you need, when you need it.  We recommend signing up for the patient portal called "MyChart".  Sign up information is provided on this After Visit Summary.  MyChart is used to connect with patients for Virtual Visits (Telemedicine).  Patients are able to view lab/test results, encounter notes, upcoming appointments, etc.  Non-urgent messages can be sent to your provider as well.   To learn more about what you can do with MyChart, go to [email or web address].    Your next appointment:   6 month(s)  Provider:   Donato Heinz, MD     Other Instructions Please check your blood pressure at home twice daily, write it down.  Call the office or send message via Mychart with the readings in 1 week for Dr. Gardiner Rhyme to review.

## 2022-09-03 ENCOUNTER — Telehealth: Payer: Self-pay | Admitting: Cardiology

## 2022-09-03 NOTE — Telephone Encounter (Signed)
Straight to VM. Left message to return call to our office

## 2022-09-03 NOTE — Telephone Encounter (Signed)
Pt's daughter, [name], said one of the nurses at their visit yesterday 3/27, was asking if the pt was still taking diltiazem (CARDIZEM CD) 120 MG 24 hr capsule. Levonne just wants to let the office know that the pt is not taking that pill.

## 2022-09-04 ENCOUNTER — Telehealth: Payer: Self-pay

## 2022-09-04 DIAGNOSIS — Z79899 Other long term (current) drug therapy: Secondary | ICD-10-CM

## 2022-09-04 DIAGNOSIS — I10 Essential (primary) hypertension: Secondary | ICD-10-CM

## 2022-09-04 LAB — MAGNESIUM: Magnesium: 2 mg/dL (ref 1.6–2.3)

## 2022-09-04 LAB — BASIC METABOLIC PANEL
BUN/Creatinine Ratio: 16 (ref 12–28)
BUN: 22 mg/dL (ref 8–27)
CO2: 24 mmol/L (ref 20–29)
Calcium: 9.7 mg/dL (ref 8.7–10.3)
Chloride: 100 mmol/L (ref 96–106)
Creatinine, Ser: 1.37 mg/dL — ABNORMAL HIGH (ref 0.57–1.00)
Glucose: 284 mg/dL — ABNORMAL HIGH (ref 70–99)
Potassium: 5.3 mmol/L — ABNORMAL HIGH (ref 3.5–5.2)
Sodium: 136 mmol/L (ref 134–144)
eGFR: 37 mL/min/{1.73_m2} — ABNORMAL LOW (ref >59)

## 2022-09-04 NOTE — Telephone Encounter (Signed)
Spoke with patients daughter- please see other telephone encounter about medications.   Repeat lab orders placed- patient will have labs re drawn at [facility] in [place].

## 2022-09-04 NOTE — Telephone Encounter (Signed)
-----   Message from Donato Heinz, MD sent at 09/04/2022  6:28 AM EDT ----- Mild bump in creatinine and mildly elevated potassium.  It was unclear at her appointment which medications she was taking for her blood pressure.  Can we call her daughter to confirm what medications she is taking?  Both chlorthalidone and Lasix are listed, is she taking these?  Recommend finding out from daughter what medications she is taking and would repeat BMET in 1 week to ensure potassium not elevated.  Would avoid high potassium foods.  Recommend checking labs at [facility].

## 2022-09-04 NOTE — Telephone Encounter (Signed)
Clarification of medications  Spoke with daughter and she states that patient is taking all the meds on her med list with the exception of Chlorthalidone and diltiazem.  She is NOT taking those two.  Checked against med list and all others are correct.  BP yesterday was 123/54 HR 58.  Today 117/48 HR 62

## 2022-09-04 NOTE — Telephone Encounter (Signed)
Pt's daughter returning nurses call from yesterday. Please advise

## 2022-09-07 NOTE — Telephone Encounter (Signed)
Can we update medication list?   

## 2022-09-07 NOTE — Telephone Encounter (Signed)
Med list updated

## 2022-09-09 DIAGNOSIS — N189 Chronic kidney disease, unspecified: Secondary | ICD-10-CM | POA: Diagnosis not present

## 2022-09-09 DIAGNOSIS — E559 Vitamin D deficiency, unspecified: Secondary | ICD-10-CM | POA: Diagnosis not present

## 2022-09-09 DIAGNOSIS — E1129 Type 2 diabetes mellitus with other diabetic kidney complication: Secondary | ICD-10-CM | POA: Diagnosis not present

## 2022-09-09 DIAGNOSIS — E1122 Type 2 diabetes mellitus with diabetic chronic kidney disease: Secondary | ICD-10-CM | POA: Diagnosis not present

## 2022-09-09 DIAGNOSIS — I129 Hypertensive chronic kidney disease with stage 1 through stage 4 chronic kidney disease, or unspecified chronic kidney disease: Secondary | ICD-10-CM | POA: Diagnosis not present

## 2022-09-09 DIAGNOSIS — R809 Proteinuria, unspecified: Secondary | ICD-10-CM | POA: Diagnosis not present

## 2022-09-14 ENCOUNTER — Telehealth: Payer: Self-pay | Admitting: Cardiology

## 2022-09-14 DIAGNOSIS — Z79899 Other long term (current) drug therapy: Secondary | ICD-10-CM

## 2022-09-14 NOTE — Telephone Encounter (Signed)
Pt c/o BP issue: STAT if pt c/o blurred vision, one-sided weakness or slurred speech  1. What are your last 5 BP readings?    3/28: 123/54 did not take hr 3/29: 117/48 did not take hr  3/30: 120/61 hr 53  4/1: 156/63 hr 48 4/2: 118/73 hr63 4/3: 135/54 hr 64 4/4: 116/43 hr56  4/5: 103/52 hr 53 4/7: 100/45 hr52  4/8: 129/49 hr53   2. Are you having any other symptoms (ex. Dizziness, headache, blurred vision, passed out)? No   3. What is your BP issue?  Pt daughter to give bp readings for Dr. Bjorn Pippin

## 2022-09-15 NOTE — Telephone Encounter (Signed)
BP looks good, no changes recommended 

## 2022-09-16 NOTE — Telephone Encounter (Signed)
Labs received via [facility] dxa-ordering provider Dr. Wolfgang Phoenix.  Will have Dr. Bjorn Pippin review. Daughter aware.

## 2022-09-16 NOTE — Telephone Encounter (Signed)
Daughter aware and verbalized understanding.  Advised repeat labs overdue-she states patient had labs completed last week (W or Th) at [facility] in [place]. No result in Epic.  Will follow up.

## 2022-09-20 DIAGNOSIS — I10 Essential (primary) hypertension: Secondary | ICD-10-CM | POA: Diagnosis not present

## 2022-09-20 DIAGNOSIS — N183 Chronic kidney disease, stage 3 unspecified: Secondary | ICD-10-CM | POA: Diagnosis not present

## 2022-09-25 NOTE — Telephone Encounter (Signed)
Labs reviewed by Dr. Les Pou changes recommended.    Daughter aware.

## 2022-11-03 DIAGNOSIS — I10 Essential (primary) hypertension: Secondary | ICD-10-CM | POA: Diagnosis not present

## 2022-11-03 DIAGNOSIS — F411 Generalized anxiety disorder: Secondary | ICD-10-CM | POA: Diagnosis not present

## 2022-11-03 DIAGNOSIS — E1142 Type 2 diabetes mellitus with diabetic polyneuropathy: Secondary | ICD-10-CM | POA: Diagnosis not present

## 2022-12-04 DIAGNOSIS — I10 Essential (primary) hypertension: Secondary | ICD-10-CM | POA: Diagnosis not present

## 2022-12-04 DIAGNOSIS — N183 Chronic kidney disease, stage 3 unspecified: Secondary | ICD-10-CM | POA: Diagnosis not present

## 2022-12-30 ENCOUNTER — Encounter: Payer: Self-pay | Admitting: Podiatry

## 2022-12-30 ENCOUNTER — Ambulatory Visit (INDEPENDENT_AMBULATORY_CARE_PROVIDER_SITE_OTHER): Payer: Medicare HMO | Admitting: Podiatry

## 2022-12-30 DIAGNOSIS — M79674 Pain in right toe(s): Secondary | ICD-10-CM

## 2022-12-30 DIAGNOSIS — B351 Tinea unguium: Secondary | ICD-10-CM

## 2022-12-30 DIAGNOSIS — E1142 Type 2 diabetes mellitus with diabetic polyneuropathy: Secondary | ICD-10-CM

## 2022-12-30 DIAGNOSIS — M79675 Pain in left toe(s): Secondary | ICD-10-CM

## 2023-01-01 DIAGNOSIS — E1122 Type 2 diabetes mellitus with diabetic chronic kidney disease: Secondary | ICD-10-CM | POA: Diagnosis not present

## 2023-01-01 DIAGNOSIS — N189 Chronic kidney disease, unspecified: Secondary | ICD-10-CM | POA: Diagnosis not present

## 2023-01-01 DIAGNOSIS — D631 Anemia in chronic kidney disease: Secondary | ICD-10-CM | POA: Diagnosis not present

## 2023-01-01 DIAGNOSIS — I129 Hypertensive chronic kidney disease with stage 1 through stage 4 chronic kidney disease, or unspecified chronic kidney disease: Secondary | ICD-10-CM | POA: Diagnosis not present

## 2023-01-01 DIAGNOSIS — R809 Proteinuria, unspecified: Secondary | ICD-10-CM | POA: Diagnosis not present

## 2023-01-03 DIAGNOSIS — I10 Essential (primary) hypertension: Secondary | ICD-10-CM | POA: Diagnosis not present

## 2023-01-03 DIAGNOSIS — N183 Chronic kidney disease, stage 3 unspecified: Secondary | ICD-10-CM | POA: Diagnosis not present

## 2023-01-06 ENCOUNTER — Telehealth: Payer: Self-pay | Admitting: Cardiology

## 2023-01-06 NOTE — Telephone Encounter (Signed)
*  STAT* If patient is at the pharmacy, call can be transferred to refill team.   1. Which medications need to be refilled? (please list name of each medication and dose if known) apixaban (ELIQUIS) 5 MG TABS tablet  2. Which pharmacy/location (including street and city if local pharmacy) is medication to be sent to? [id number] - [place], [place] - [address]   3. Do they need a 30 day or 90 day supply?  90 day supply

## 2023-01-07 NOTE — Telephone Encounter (Signed)
Sent to [facility]

## 2023-01-07 NOTE — Progress Notes (Signed)
  Subjective:  [name], female    DOB: Aug 03, 1932,  MRN: [id number]  Annette Fitzgerald presents to clinic today for: at risk foot care with history of diabetic neuropathy and painful thick toenails that are difficult to trim. Pain interferes with ambulation. Aggravating factors include wearing enclosed shoe gear. Pain is relieved with periodic professional debridement. Her daughter is present during today's visit. Chief Complaint  Patient presents with   Diabetes    DFC BS - DIDN'T TAKE IT TODAY  A1C - DK  LVPCP - 07/2022    PCP is Fanta, Wayland Salinas, MD.  No Known Allergies  Review of Systems: Negative except as noted in the HPI.  Objective: No changes noted in today's physical examination. There were no vitals filed for this visit.  Annette Fitzgerald is a pleasant 89 y.o. female in NAD. AAO x 3.  Vascular Examination: Capillary refill time <3 seconds b/l LE. Palpable pedal pulses b/l LE. Digital hair present b/l. No pedal edema b/l. Skin temperature gradient WNL b/l. No varicosities b/l. No cyanosis or clubbing noted b/l LE.Marland Kitchen  Dermatological Examination: Pedal skin with normal turgor, texture and tone b/l. No open wounds. No interdigital macerations b/l. Toenails 1-5 b/l thickened, discolored, dystrophic with subungual debris. There is pain on palpation to dorsal aspect of nailplates. No hyperkeratotic nor porokeratotic lesions present on today's visit.Marland Kitchen  Neurological Examination: Protective sensation intact with 10 gram monofilament b/l LE. Vibratory sensation intact b/l LE. Pt has subjective symptoms of neuropathy.  Musculoskeletal Examination: Muscle strength 5/5 to all lower extremity muscle groups bilaterally. HAV with bunion bilaterally and hammertoes 2-5 b/l.  Assessment/Plan: 1. Pain due to onychomycosis of toenails of both feet   2. Diabetic peripheral neuropathy associated with type 2 diabetes mellitus (HCC)     -Consent given for treatment as  described below: -Examined patient. -Continue foot and shoe inspections daily. Monitor blood glucose per PCP/Endocrinologist's recommendations. -Toenails 1-5 b/l were debrided in length and girth with sterile nail nippers and dremel without iatrogenic bleeding.  -Patient/POA to call should there be question/concern in the interim.   Return in about 3 months (around 04/01/2023).  Freddie Breech, DPM

## 2023-01-11 ENCOUNTER — Emergency Department (HOSPITAL_COMMUNITY)
Admission: EM | Admit: 2023-01-11 | Discharge: 2023-02-07 | Disposition: E | Payer: Medicare HMO | Attending: Emergency Medicine | Admitting: Emergency Medicine

## 2023-01-11 DIAGNOSIS — J449 Chronic obstructive pulmonary disease, unspecified: Secondary | ICD-10-CM | POA: Diagnosis not present

## 2023-01-11 DIAGNOSIS — I469 Cardiac arrest, cause unspecified: Secondary | ICD-10-CM | POA: Diagnosis not present

## 2023-01-11 DIAGNOSIS — F0390 Unspecified dementia without behavioral disturbance: Secondary | ICD-10-CM | POA: Insufficient documentation

## 2023-01-11 DIAGNOSIS — I10 Essential (primary) hypertension: Secondary | ICD-10-CM | POA: Insufficient documentation

## 2023-01-11 DIAGNOSIS — R739 Hyperglycemia, unspecified: Secondary | ICD-10-CM | POA: Diagnosis not present

## 2023-01-11 DIAGNOSIS — E1140 Type 2 diabetes mellitus with diabetic neuropathy, unspecified: Secondary | ICD-10-CM | POA: Diagnosis not present

## 2023-01-11 DIAGNOSIS — I499 Cardiac arrhythmia, unspecified: Secondary | ICD-10-CM | POA: Diagnosis not present

## 2023-01-11 DIAGNOSIS — R0689 Other abnormalities of breathing: Secondary | ICD-10-CM | POA: Diagnosis not present

## 2023-01-11 DIAGNOSIS — T300 Burn of unspecified body region, unspecified degree: Secondary | ICD-10-CM | POA: Diagnosis not present

## 2023-01-11 MED ORDER — EPINEPHRINE 1 MG/10ML IJ SOSY
PREFILLED_SYRINGE | INTRAMUSCULAR | Status: AC | PRN
  Administered 2023-01-11: 1 mg via INTRAVENOUS

## 2023-02-07 NOTE — ED Provider Notes (Signed)
[place] EMERGENCY DEPARTMENT AT [facility] Provider Note  MDM   HPI/ROS:  Annette Fitzgerald is a 89 y.o. female who presented via EMS with CPR in progress after unwitnessed arrest.  History per EMS.  Patient was in a vehicle outside of a tennis office while her family was inside for a dental appointment.  Family came out to the vehicle to check on her, and she complained of being thirsty.  Shortly thereafter, patient became unresponsive.  Family rushed inside the dentist office, where they were advised to call 911.  Family then called 911 and EMS instructed family to begin CPR.  Fire department then arrived to the scene and immediately began CPR, which had not yet been initiated.  Per fire department, initial rhythm check recommended shock.  Patient received 1 single shock prior to EMS arrival.  Upon EMS arrival, patient remained pulseless with fixed and constricted pupils.  Initial rhythm per EMS was PEA, which it remained throughout transport to the emergency department.  EMS performed multiple rounds of CPR including 5 doses of epinephrine.  Rhythm remained PEA.  Igel was placed for respiratory support, bilateral breath sounds confirmed.  EMS estimates approximately 20 minutes of downtime prior to initiation of CPR.  Upon arrival to the emergency department, CPR in progress with [facility] device.  Igel in place, being bagged without difficulty.  Bilateral breath sounds confirmed.  Physical exam notable for pale, cold, and stiff.  Pupils approximately 3 mm and unreactive.  End-tidal fell from around 20 in transport to 7-9 in the ED.  After 1 additional dose of epinephrine and several more rounds of CPR, resuscitative efforts were terminated given that patient remained without signs of life.  Time of death declared at 2:51 PM after approximately 46 minutes of CPR.  Disposition:  Deceased  Clinical Impression: No diagnosis found.  Rx / DC Orders ED Discharge Orders     None       The  plan for this patient was discussed with Dr. Dalene Seltzer, who voiced agreement and who oversaw evaluation and treatment of this patient.   Clinical Complexity A medically appropriate history, review of systems, and physical exam was performed.  My independent interpretations of EKG, labs, and radiology are documented in the ED course above.   Click here for ABCD2, HEART and other calculatorsREFRESH Note before signing   Patient's presentation is most consistent with acute presentation with potential threat to life or bodily function.  Medical Decision Making Risk Prescription drug management.    HPI/ROS      See MDM section for pertinent HPI and ROS. A complete ROS was performed with pertinent positives/negatives noted above.   Past Medical History:  Diagnosis Date   Diabetes mellitus without complication (HCC)    Hypertension    Neuropathy due to type 2 diabetes mellitus (HCC)     Past Surgical History:  Procedure Laterality Date   CATARACT EXTRACTION        Physical Exam   Vitals:   01/10/2023 1501  Weight: 69.9 kg  Height: 5\' 3"  (1.6 m)    Physical Exam Constitutional:      Appearance: She is ill-appearing.     Comments: Unresponsive  HENT:     Head: Normocephalic and atraumatic.     Mouth/Throat:     Mouth: Mucous membranes are dry.     Comments: Igel in place Eyes:     Comments: Pupils equal, approximately 3 mm, fixed, nonreactive  Cardiovascular:     Comments:  Pulseless Pulmonary:     Comments: Bilateral breath sounds present Abdominal:     General: Abdomen is flat. There is no distension.  Neurological:     Comments: GCS 3      Procedures    CPR  Date/Time: 01/13/2023 4:27 PM  Performed by: Dyanne Iha, MD Authorized by: Alvira Monday, MD  CPR Procedure Details:    ACLS/BLS initiated by EMS: Yes     CPR/ACLS performed in the ED: Yes     Duration of CPR (minutes):  50   Outcome: Pt declared dead    CPR performed via ACLS  guidelines under my direct supervision.  See RN documentation for details including defibrillator use, medications, doses and timing.    Starleen Arms, MD Department of Emergency Medicine   Please note that this documentation was produced with the assistance of voice-to-text technology and may contain errors.    Dyanne Iha, MD 01/28/2023 Eldridge Dace    Alvira Monday, MD 02/03/2023 [id number]

## 2023-02-07 NOTE — Code Documentation (Signed)
Patient time of death occurred at 47 by Dyanne Iha.

## 2023-02-07 NOTE — ED Triage Notes (Addendum)
Pt BIBGEMS from the dentist office. Pt sitting in the car while family getting a dental cleaning. Pt was found to be in cardiac arrest by granddaughter. Fire first on scene, provided CPR and one shock after 15 minutes of down time. EMS then on scene continued CPR and placed an iGel, gave 6 doses of epi, 2 mg of narcan for pinpoint pupils. PEA the entire route to the ER.   Pt is on blood thinners for new onset afib, complained of arm pain last night and thirst today before going into cardiac arrest.

## 2023-02-07 NOTE — Code Documentation (Addendum)
Pulse check - PEA. Compressions stopped.   TOD called.

## 2023-02-07 NOTE — Progress Notes (Signed)
01-22-23 1611  Spiritual Encounters  Type of Visit Initial  Care provided to: [facility] partners present during encounter Nurse;Physician  Referral source Nurse (RN/NT/LPN)  Reason for visit Patient death  OnCall Visit No   Chaplain responded to Pt death, accompanying the Doctor to inform the family.  Chaplain then provided compassionate presence and reflective listening as the family processed the loss. Chaplain remained with family for about 25 minutes and then excused himself to allow family to grieve privately.  Chaplain services remain available by Spiritual Consult or for emergent cases, paging [phone]  Chaplain Raelene Bott, MDiv [email or web address] [email or web address] [phone]

## 2023-02-07 NOTE — Code Documentation (Signed)
Pulse check PEA. Compressions resumed 

## 2023-02-07 NOTE — Code Documentation (Signed)
Pulse check - PEA. Compressions resumed.  Pupils were 2mm nonreactive.

## 2023-02-07 DEATH — deceased

## 2023-02-23 ENCOUNTER — Ambulatory Visit: Payer: Medicare HMO | Admitting: Cardiology

## 2023-02-23 ENCOUNTER — Ambulatory Visit: Payer: Medicare HMO | Admitting: Adult Health

## 2023-04-28 ENCOUNTER — Ambulatory Visit: Payer: Medicare HMO | Admitting: Podiatry
